# Patient Record
Sex: Female | Born: 1965 | Race: White | Hispanic: No | Marital: Married | Smoking: Never smoker
Health system: Southern US, Community
[De-identification: ages and names within clinical notes are randomized; demographics above are authoritative.]

## PROBLEM LIST (undated history)

## (undated) DIAGNOSIS — K259 Gastric ulcer, unspecified as acute or chronic, without hemorrhage or perforation: Secondary | ICD-10-CM

## (undated) DIAGNOSIS — F329 Major depressive disorder, single episode, unspecified: Secondary | ICD-10-CM

## (undated) DIAGNOSIS — K649 Unspecified hemorrhoids: Secondary | ICD-10-CM

## (undated) DIAGNOSIS — K200 Eosinophilic esophagitis: Secondary | ICD-10-CM

## (undated) DIAGNOSIS — S1121XA Laceration without foreign body of pharynx and cervical esophagus, initial encounter: Secondary | ICD-10-CM

## (undated) DIAGNOSIS — N6091 Unspecified benign mammary dysplasia of right breast: Secondary | ICD-10-CM

## (undated) DIAGNOSIS — D509 Iron deficiency anemia, unspecified: Secondary | ICD-10-CM

## (undated) DIAGNOSIS — E785 Hyperlipidemia, unspecified: Secondary | ICD-10-CM

## (undated) DIAGNOSIS — I10 Essential (primary) hypertension: Secondary | ICD-10-CM

## (undated) DIAGNOSIS — F419 Anxiety disorder, unspecified: Secondary | ICD-10-CM

## (undated) DIAGNOSIS — F32A Depression, unspecified: Secondary | ICD-10-CM

## (undated) DIAGNOSIS — K222 Esophageal obstruction: Secondary | ICD-10-CM

## (undated) DIAGNOSIS — D131 Benign neoplasm of stomach: Secondary | ICD-10-CM

## (undated) DIAGNOSIS — K219 Gastro-esophageal reflux disease without esophagitis: Secondary | ICD-10-CM

## (undated) HISTORY — DX: Benign neoplasm of stomach: D13.1

## (undated) HISTORY — DX: Hyperlipidemia, unspecified: E78.5

## (undated) HISTORY — DX: Major depressive disorder, single episode, unspecified: F32.9

## (undated) HISTORY — DX: Gastric ulcer, unspecified as acute or chronic, without hemorrhage or perforation: K25.9

## (undated) HISTORY — PX: DILATION AND CURETTAGE OF UTERUS: SHX78

## (undated) HISTORY — PX: TUBAL LIGATION: SHX77

## (undated) HISTORY — DX: Esophageal obstruction: K22.2

## (undated) HISTORY — DX: Laceration without foreign body of pharynx and cervical esophagus, initial encounter: S11.21XA

## (undated) HISTORY — PX: UPPER GASTROINTESTINAL ENDOSCOPY: SHX188

## (undated) HISTORY — DX: Gastro-esophageal reflux disease without esophagitis: K21.9

## (undated) HISTORY — DX: Essential (primary) hypertension: I10

## (undated) HISTORY — DX: Unspecified hemorrhoids: K64.9

## (undated) HISTORY — DX: Eosinophilic esophagitis: K20.0

## (undated) HISTORY — PX: COLONOSCOPY: SHX174

## (undated) HISTORY — DX: Depression, unspecified: F32.A

## (undated) HISTORY — DX: Iron deficiency anemia, unspecified: D50.9

## (undated) HISTORY — DX: Anxiety disorder, unspecified: F41.9

---
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
## 1997-09-26 ENCOUNTER — Other Ambulatory Visit: Admission: RE | Admit: 1997-09-26 | Discharge: 1997-09-26 | Payer: Self-pay | Admitting: Obstetrics and Gynecology

## 1998-10-05 ENCOUNTER — Other Ambulatory Visit: Admission: RE | Admit: 1998-10-05 | Discharge: 1998-10-05 | Payer: Self-pay | Admitting: Obstetrics and Gynecology

## 1999-11-04 ENCOUNTER — Other Ambulatory Visit: Admission: RE | Admit: 1999-11-04 | Discharge: 1999-11-04 | Payer: Self-pay | Admitting: Obstetrics and Gynecology

## 2001-01-20 ENCOUNTER — Other Ambulatory Visit: Admission: RE | Admit: 2001-01-20 | Discharge: 2001-01-20 | Payer: Self-pay | Admitting: Obstetrics and Gynecology

## 2002-05-23 ENCOUNTER — Other Ambulatory Visit: Admission: RE | Admit: 2002-05-23 | Discharge: 2002-05-23 | Payer: Self-pay | Admitting: Obstetrics and Gynecology

## 2003-07-12 ENCOUNTER — Other Ambulatory Visit: Admission: RE | Admit: 2003-07-12 | Discharge: 2003-07-12 | Payer: Self-pay | Admitting: Obstetrics and Gynecology

## 2004-02-02 ENCOUNTER — Encounter (INDEPENDENT_AMBULATORY_CARE_PROVIDER_SITE_OTHER): Payer: Self-pay | Admitting: *Deleted

## 2004-02-02 ENCOUNTER — Ambulatory Visit (HOSPITAL_COMMUNITY): Admission: RE | Admit: 2004-02-02 | Discharge: 2004-02-02 | Payer: Self-pay | Admitting: Obstetrics and Gynecology

## 2005-06-24 ENCOUNTER — Other Ambulatory Visit: Admission: RE | Admit: 2005-06-24 | Discharge: 2005-06-24 | Payer: Self-pay | Admitting: Obstetrics and Gynecology

## 2005-08-13 ENCOUNTER — Ambulatory Visit: Payer: Self-pay | Admitting: Cardiology

## 2005-09-30 ENCOUNTER — Ambulatory Visit: Payer: Self-pay | Admitting: Cardiology

## 2005-12-02 ENCOUNTER — Ambulatory Visit: Payer: Self-pay | Admitting: Cardiology

## 2006-05-19 DIAGNOSIS — K259 Gastric ulcer, unspecified as acute or chronic, without hemorrhage or perforation: Secondary | ICD-10-CM

## 2006-05-19 HISTORY — DX: Gastric ulcer, unspecified as acute or chronic, without hemorrhage or perforation: K25.9

## 2006-09-10 ENCOUNTER — Ambulatory Visit: Payer: Self-pay | Admitting: Cardiology

## 2006-10-11 ENCOUNTER — Encounter: Payer: Self-pay | Admitting: Gastroenterology

## 2006-10-12 ENCOUNTER — Encounter: Payer: Self-pay | Admitting: Gastroenterology

## 2006-10-13 ENCOUNTER — Encounter: Payer: Self-pay | Admitting: Gastroenterology

## 2006-10-14 ENCOUNTER — Encounter: Payer: Self-pay | Admitting: Gastroenterology

## 2006-11-03 ENCOUNTER — Encounter: Payer: Self-pay | Admitting: Gastroenterology

## 2007-10-13 ENCOUNTER — Ambulatory Visit: Payer: Self-pay | Admitting: Cardiology

## 2009-05-19 DIAGNOSIS — K200 Eosinophilic esophagitis: Secondary | ICD-10-CM

## 2009-05-19 HISTORY — DX: Eosinophilic esophagitis: K20.0

## 2010-02-04 ENCOUNTER — Encounter (INDEPENDENT_AMBULATORY_CARE_PROVIDER_SITE_OTHER): Payer: Self-pay | Admitting: *Deleted

## 2010-02-11 ENCOUNTER — Ambulatory Visit: Payer: Self-pay | Admitting: Gastroenterology

## 2010-02-11 DIAGNOSIS — R1319 Other dysphagia: Secondary | ICD-10-CM

## 2010-02-12 ENCOUNTER — Ambulatory Visit: Payer: Self-pay | Admitting: Gastroenterology

## 2010-02-13 ENCOUNTER — Ambulatory Visit (HOSPITAL_COMMUNITY): Admission: RE | Admit: 2010-02-13 | Discharge: 2010-02-13 | Payer: Self-pay | Admitting: Gastroenterology

## 2010-02-13 ENCOUNTER — Telehealth (INDEPENDENT_AMBULATORY_CARE_PROVIDER_SITE_OTHER): Payer: Self-pay

## 2010-02-19 ENCOUNTER — Encounter: Payer: Self-pay | Admitting: Gastroenterology

## 2010-03-04 ENCOUNTER — Ambulatory Visit: Payer: Self-pay | Admitting: Gastroenterology

## 2010-03-04 DIAGNOSIS — K200 Eosinophilic esophagitis: Secondary | ICD-10-CM

## 2010-03-04 DIAGNOSIS — K222 Esophageal obstruction: Secondary | ICD-10-CM

## 2010-05-02 ENCOUNTER — Ambulatory Visit: Payer: Self-pay | Admitting: Gastroenterology

## 2010-06-20 NOTE — Letter (Signed)
Summary: EGD Instructions  [facility]  [address] [place], [place] [address]   Phone: [phone]  Fax: [phone]       [name]    02/22/1966    MRN: [id number]       Procedure Day /Date: Tuesday September 27th, 2011     Arrival Time:  12:30pm     Procedure Time: 1:30pm     Location of Procedure:                    _x  _ [facility] (4th Floor)    PREPARATION FOR ENDOSCOPY   On 02/12/10 THE DAY OF THE PROCEDURE:  1.   No solid foods, milk or milk products are allowed after midnight the night before your procedure.  2.   Do not drink anything colored red or purple.  Avoid juices with pulp.  No orange juice.  3.  You may drink clear liquids until11:30am, which is 2 hours before your procedure.                                                                                                CLEAR LIQUIDS INCLUDE: Water Jello Ice Popsicles Tea (sugar ok, no milk/cream) Powdered fruit flavored drinks Coffee (sugar ok, no milk/cream) Gatorade Juice: apple, white grape, white cranberry  Lemonade Clear bullion, consomm, broth Carbonated beverages (any kind) Strained chicken noodle soup Hard Candy   MEDICATION INSTRUCTIONS  Unless otherwise instructed, you should take regular prescription medications with a small sip of water as early as possible the morning of your procedure.        OTHER INSTRUCTIONS  You will need a responsible adult at least 44 years of age to accompany you and drive you home.   This person must remain in the waiting room during your procedure.  Wear loose fitting clothing that is easily removed.  Leave jewelry and other valuables at home.  However, you may wish to bring a book to read or an iPod/MP3 player to listen to music as you wait for your procedure to start.  Remove all body piercing jewelry and leave at home.  Total time from sign-in until discharge is approximately 2-3 hours.  You should go home  directly after your procedure and rest.  You can resume normal activities the day after your procedure.  The day of your procedure you should not:   Drive   Make legal decisions   Operate machinery   Drink alcohol   Return to work  You will receive specific instructions about eating, activities and medications before you leave.    The above instructions have been reviewed and explained to me by   Marchelle Folks.     I fully understand and can verbalize these instructions _____________________________ Date _________

## 2010-06-20 NOTE — Assessment & Plan Note (Signed)
Summary: F/U APPT...LSW.   History of Present Illness Visit Type: Follow-up Visit Primary GI MD: Elie Goody MD [facility] Primary Provider: Verlee Rossetti, MD  Requesting Provider: na Chief Complaint: F/u for dysphagia. Pt states that she is fine and denies any GI complaints  History of Present Illness:   [name] has had complete resolution of her dysphagia on Flovent. She discontinued omeprazole. She has no gastrointestinal complaints.   GI Review of Systems      Denies abdominal pain, acid reflux, belching, bloating, chest pain, dysphagia with liquids, dysphagia with solids, heartburn, loss of appetite, nausea, vomiting, vomiting blood, weight loss, and  weight gain.        Denies anal fissure, black tarry stools, change in bowel habit, constipation, diarrhea, diverticulosis, fecal incontinence, heme positive stool, hemorrhoids, irritable bowel syndrome, jaundice, light color stool, liver problems, rectal bleeding, and  rectal pain.   Current Medications (verified): 1)  Zoloft 100 Mg Tabs (Sertraline Hcl) .... Take 2 Tabs Every Morning 2)  Bupropion Hcl 150 Mg Xr24h-Tab (Bupropion Hcl) .... Take Three Tabs By Mouth Every Morning 3)  Ambien 10 Mg Tabs (Zolpidem Tartrate) .... Take 1/2 To 1 Tablet By Mouth At Bedtime 4)  One-A-Day Extras Antioxidant  Caps (Multiple Vitamins-Minerals) .... Take One By Mouth Once Daily 5)  Vitamin D 1000 Unit Tabs (Cholecalciferol) .... Take One By Mouth Once Daily 6)  Flovent Hfa 110 Mcg/act Aero (Fluticasone Propionate  Hfa) .... As Directed  Allergies (verified): No Known Drug Allergies  Past History:  Past Medical History: Hyperlipidemia Anxiety Disorder Depression Esophageal Stricture 2008 Eosinophilic esophagitis 2011 Fundic gland polyps Antral Ulcer Microcytic Hyperchromic anemia Partial esophageal tear Iron-deficiency anemia GERD   Past Surgical History: Reviewed history from 02/11/2010 and no changes required. Knee surgery  1986 C-section x 2 6045,4098 D & C Tubal Ligation  Family History: Reviewed history from 02/11/2010 and no changes required. Family History of Heart Disease: Mother Family History of Diabetes: mother No FH of Colon Cancer:  Social History: Reviewed history from 02/11/2010 and no changes required. 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  Alcohol Use - no Daily Caffeine Use 16 ounces a day Illicit Drug Use - no  Review of Systems       The pertinent positives and negatives are noted as above and in the HPI. All other ROS were reviewed and were negative.   Vital Signs:  Patient profile:   45 year old female Height:      64.5 inches Weight:      153 pounds BMI:     25.95 BSA:     1.76 Pulse rate:   76 / minute Pulse rhythm:   regular BP sitting:   120 / 64  (left arm) Cuff size:   regular  Vitals Entered By: Ok Anis CMA (May 02, 2010 10:25 AM)  Physical Exam  General:  Well developed, well nourished, no acute distress. Head:  Normocephalic and atraumatic. Eyes:  PERRLA, no icterus. Mouth:  No deformity or lesions, dentition normal. Lungs:  Clear throughout to auscultation. Heart:  Regular rate and rhythm; no murmurs, rubs,  or bruits. Abdomen:  Soft, nontender and nondistended. No masses, hepatosplenomegaly or hernias noted. Normal bowel sounds. Psych:  Alert and cooperative. Normal mood and affect.  Impression & Recommendations:  Problem # 1:  EOSINOPHILIC ESOPHAGITIS (ICD-530.13) Excellent symptomatic response to Flovent. Continue Flovent. Referral to an allergist to evaluate for food allergies. She states her primary physician is planning to start  the evaluation with blood testing. She can obtain a referral via her primary care's office or our office per her preference.  Patient Instructions: 1)  Refill of Flovent has been sent to your pharmacy, swallow 4 puffs three times a day x 6 weeks- take out spacer. 2)  Please schedule a follow-up  appointment in 3 months. 3)  Copy sent to : Verlee Rossetti, MD 4)  The medication list was reviewed and reconciled.  All changed / newly prescribed medications were explained.  A complete medication list was provided to the patient / caregiver.  Prescriptions: FLOVENT HFA 110 MCG/ACT AERO (FLUTICASONE PROPIONATE  HFA) swallow 4 puffs three times a day x 6 weeks- take out spacer  #1 x 1   Entered by:   Christie Nottingham CMA (AAMA)   Authorized by:   Meryl Dare MD [facility]   Signed by:   Christie Nottingham CMA (AAMA) on 05/02/2010   Method used:   Electronically to        [facility]* (retail)       [address]       [place], [place]  [address]       Ph: [phone]       Fax: [phone]   RxID:   [id number]

## 2010-06-20 NOTE — Procedures (Signed)
Summary: EGD/Timothy Misenheimer MD  EGD/Timothy Misenheimer MD   Imported By: Lester Orono 02/13/2010 08:01:35  _____________________________________________________________________  External Attachment:    Type:   Image     Comment:   External Document

## 2010-06-20 NOTE — Assessment & Plan Note (Signed)
Summary: f/u EGD, discuss results/all   History of Present Illness Visit Type: Follow-up Visit Primary GI MD: Elie Goody MD [facility] Primary Provider: Raenette Rover, MD Chief Complaint: follow-up EGD  History of Present Illness:   [name] returns today with improved symptoms of dysphasia. She still has occasional dysphagia when eating solids rapidly or drinking cold or carbonated beverages. Esophageal biopsies revealed findings consistent with eosinophilic esophagitis.   GI Review of Systems    Reports dysphagia with liquids and  dysphagia with solids.      Denies abdominal pain, acid reflux, belching, bloating, chest pain, heartburn, loss of appetite, nausea, vomiting, vomiting blood, weight loss, and  weight gain.        Denies anal fissure, black tarry stools, change in bowel habit, constipation, diarrhea, diverticulosis, fecal incontinence, heme positive stool, hemorrhoids, irritable bowel syndrome, jaundice, light color stool, liver problems, rectal bleeding, and  rectal pain.   Current Medications (verified): 1)  Zoloft 100 Mg Tabs (Sertraline Hcl) .... Take 2 Tabs Every Morning 2)  Bupropion Hcl 150 Mg Xr24h-Tab (Bupropion Hcl) .... Take Three Tabs By Mouth Every Morning 3)  Ambien 10 Mg Tabs (Zolpidem Tartrate) .... Take 1/2 To 1 Tablet By Mouth At Bedtime 4)  One-A-Day Extras Antioxidant  Caps (Multiple Vitamins-Minerals) .... Take One By Mouth Once Daily 5)  Vitamin D 1000 Unit Tabs (Cholecalciferol) .... Take One By Mouth Once Daily 6)  Omeprazole 20 Mg Cpdr (Omeprazole) .... One Tablet By Mouth Every Morning  Allergies (verified): No Known Drug Allergies  Past History:  Past Medical History: Hyperlipidemia Anxiety Disorder Depression Esophageal Stricture 2008 Eosinophilic esophagitis 2011 Fundic gland polyps Antral Ulcer Microcytic Hyperchromic anemia Partial esophageal tear Iron-deficiency anemia  Past Surgical History: Reviewed history from 02/11/2010  and no changes required. Knee surgery 1986 C-section x 2 2956,2130 D & C Tubal Ligation  Family History: Reviewed history from 02/11/2010 and no changes required. Family History of Heart Disease: Mother Family History of Diabetes: mother No FH of Colon Cancer:  Social History: Reviewed history from 02/11/2010 and no changes required. 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  Alcohol Use - no Daily Caffeine Use 16 ounces a day Illicit Drug Use - no  Review of Systems       The pertinent positives and negatives are noted as above and in the HPI. All other ROS were reviewed and were negative.   Vital Signs:  Patient profile:   45 year old female Height:      64.5 inches Weight:      155.25 pounds BMI:     26.33 Pulse rate:   72 / minute Pulse rhythm:   regular BP sitting:   118 / 70  (left arm)  Vitals Entered By: Milford Cage NCMA (March 04, 2010 9:40 AM)  Physical Exam  General:  Well developed, well nourished, no acute distress. Head:  Normocephalic and atraumatic. Eyes:  PERRLA, no icterus. Mouth:  No deformity or lesions, dentition normal. Lungs:  Clear throughout to auscultation. Heart:  Regular rate and rhythm; no murmurs, rubs,  or bruits. Abdomen:  Soft, nontender and nondistended. No masses, hepatosplenomegaly or hernias noted. Normal bowel sounds. Psych:  Alert and cooperative. Normal mood and affect.  Impression & Recommendations:  Problem # 1:  EOSINOPHILIC ESOPHAGITIS (ICD-530.13) Begin treatment with Flovent, as outlined. The disease and management were discussed with the patient in detail and answered her questions.  Problem # 2:  ESOPHAGEAL STRICTURE (ICD-530.3) Esophageal stricture was noted at  the EG junction and was dilated. Her dysphagia symptoms have improved. She may have GERD with a peptic stricture. Continue antireflux measures and omeprazole 20 mg q.a.m.  Patient Instructions: 1)  Your prescription for Flovent swallow 4 puffs  three times a day x 6 weeks has been sent to your pharmacy. Please take out spacer on inhaler. Please do not eat or drink 30 minutes after you swallow the medication. 2)  Eosinophilic esophagitis brochure given.  3)  Please continue current medications.  4)  Please schedule a follow-up appointment in  8 weeks.  5)  Copy sent to : Raenette Rover, MD 6)  The medication list was reviewed and reconciled.  All changed / newly prescribed medications were explained.  A complete medication list was provided to the patient / caregiver.  Prescriptions: FLOVENT HFA 110 MCG/ACT AERO (FLUTICASONE PROPIONATE  HFA) swallow 4 puffs three times a day x 6 weeks- take out spacer  #1 x 2   Entered by:   Christie Nottingham CMA (AAMA)   Authorized by:   Meryl Dare MD [facility]   Signed by:   Christie Nottingham CMA (AAMA) on 03/04/2010   Method used:   Electronically to        [facility] (retail)       [address]       [place], [place]  [address]       Ph: [phone]       Fax: [phone]   RxID:   [id number]

## 2010-06-20 NOTE — Progress Notes (Signed)
Summary: Esophageal pain following EGD/dil  ---- Converted from flag ---- ---- 02/13/2010 9:35 AM, Meryl Dare MD [facility] wrote: NPO and schedule a 2 view chest Xray and Gastrograffin swallow STAT. Convert this to a phone note.  ---- 02/13/2010 8:09 AM, Greer Ee RN wrote: f/u call placed to pt. this a.m. she stated that when she swallow she has a pain level of 7, but the pain does not start until it gets to her chest and it is a burning feeling. please advise. ------------------------------  Phone Note Outgoing Call Call back at [facility] Phone [phone]   Call placed by: Darcey Nora RN, CGRN,  February 13, 2010 9:57 AM Call placed to: Patient Summary of Call: Discussed with the patient.  She will come to pick up rx for [facility] chest x-ray and gastrograffin swallow.  Phones at [facility] are not working.  We will have her handcarry an order.  Patient cell number [phone].  She is advised she should wait at the hospital until we call her after the x-rays Initial call taken by: Darcey Nora RN, CGRN,  February 13, 2010 9:58 AM

## 2010-06-20 NOTE — Procedures (Signed)
Summary: EGD/[facility]  EGD/[place]   Imported By: Lester Calion 02/13/2010 12:24:44  _____________________________________________________________________  External Attachment:    Type:   Image     Comment:   External Document

## 2010-06-20 NOTE — Letter (Signed)
Summary: [facility]  [facility]   Imported By: Lennie Odor 02/12/2010 16:03:34  _____________________________________________________________________  External Attachment:    Type:   Image     Comment:   External Document

## 2010-06-20 NOTE — Letter (Signed)
Summary: [facility]  [facility]   Imported By: Lester Champaign 02/13/2010 12:14:07  _____________________________________________________________________  External Attachment:    Type:   Image     Comment:   External Document

## 2010-06-20 NOTE — Letter (Signed)
Summary: Patient Notice-Endo Biopsy Results  [facility]  [address] [place], [place] [address]   Phone: [phone]  Fax: [phone]        February 19, 2010 MRN: [id number]    [name] [address] [place], [place]  [address]    Dear [name],  I am pleased to inform you that the biopsies taken during your recent endoscopic examination did not show any evidence of cancer upon pathologic examination. The biopsies showed findings suggestion eosinophilic esophagitis.  Please call [phone] to schedule a return visit to review      your condition.  Continue with the treatment plan as outlined on the day of your      exam.  Please call us if you are having persistent problems or have questions about your condition that have not been fully answered at this time.  Sincerely,  Meryl Dare MD [facility]  This letter has been electronically signed by your physician.  Appended Document: Patient Notice-Endo Biopsy Results letter mailed

## 2010-06-20 NOTE — Assessment & Plan Note (Signed)
Summary: food getting stuck in throat ...em   History of Present Illness Visit Type: Initial Visit Primary GI MD: Elie Goody MD [facility] Primary Provider: Raenette Rover, MD Chief Complaint: Patient had a EGD at [facility] and at [facility] in 2008 for a esophgeal stricture and she is now having the same issues as she did then. She complains of solid foor dysphagia and epigastric pain.  History of Present Illness:   This is a 45 year old female complaining of dysphagia. She had a food impaction while at the beach in [place] in 2008 which was removed endoscopically. I have reviewed records from Dr. Jennye Boroughs and her barium esophagram from [facility]. Apparently, there was a mucosal tear noted at EGD with no evidence of deep injury or perforation. She was hospitalized in [facility] for about 3 days and then returned for follow up care with Dr. Laurell Roof. She underwent upper endoscopy in June 2008 which showed a 13 mm esophagogastric junction stricture, which was dilated to 71 [place]. Small benign gastric fundic polyps were also noted. She states she stayed on Nexium for several months and discontinued the medication.  Over the past 2 months, se has had worsening problems with dysphagia, mainly to rice and beef. She develops chest and epigastic pain and burning when food is stuck.    GI Review of Systems    Reports dysphagia with solids.      Denies abdominal pain, acid reflux, belching, bloating, chest pain, dysphagia with liquids, heartburn, loss of appetite, nausea, vomiting, vomiting blood, weight loss, and  weight gain.        Denies anal fissure, black tarry stools, change in bowel habit, constipation, diarrhea, diverticulosis, fecal incontinence, heme positive stool, hemorrhoids, irritable bowel syndrome, jaundice, light color stool, liver problems, rectal bleeding, and  rectal pain. Preventive Screening-Counseling & Management  Alcohol-Tobacco     Smoking Status:  never      Drug Use:  no.     Current Medications (verified): 1)  Zoloft 100 Mg Tabs (Sertraline Hcl) .... Take 2 Tabs Every Morning 2)  Bupropion Hcl 150 Mg Xr24h-Tab (Bupropion Hcl) .... Take Three Tabs By Mouth Every Morning 3)  Ambien 10 Mg Tabs (Zolpidem Tartrate) .... Take 1/2 To 1 Tablet By Mouth At Bedtime 4)  One-A-Day Extras Antioxidant  Caps (Multiple Vitamins-Minerals) .... Take One By Mouth Once Daily 5)  Vitamin D 1000 Unit Tabs (Cholecalciferol) .... 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  Alcohol Use - no Daily Caffeine Use 16 ounces a day Illicit Drug Use - no Smoking Status:  never Drug Use:  no  Review of Systems       The patient complains of anxiety-new and sleeping problems.         The pertinent positives and negatives are noted as above and in the HPI. All other ROS were reviewed and were negative.   Vital Signs:  Patient profile:   45 year old female Height:      64.25 inches Weight:      154.6 pounds BMI:     26.43 Pulse rate:  88 / minute Pulse rhythm:   regular BP sitting:   120 / 78  (left arm) Cuff size:   regular  Vitals Entered By: Harlow Mares CMA Duncan Dull) (February 11, 2010 9:21 AM)  Physical Exam  General:  Well developed, well nourished, no acute distress. Head:  Normocephalic and atraumatic. Eyes:  PERRLA, no icterus. Ears:  Normal auditory acuity. Mouth:  No deformity or lesions, dentition normal. Neck:  Supple; no masses or thyromegaly. Lungs:  Clear throughout to auscultation. Heart:   Regular rate and rhythm; no murmurs, rubs,  or bruits. Abdomen:  Soft, nontender and nondistended. No masses, hepatosplenomegaly or hernias noted. Normal bowel sounds. Msk:  Symmetrical with no gross deformities. Normal posture. Pulses:  Normal pulses noted. Extremities:  No clubbing, cyanosis, edema or deformities noted. Neurologic:  Alert and  oriented x4;  grossly normal neurologically. Cervical Nodes:  No significant cervical adenopathy. Inguinal Nodes:  No significant inguinal adenopathy. Psych:  Alert and cooperative. Normal mood and affect.  Impression & Recommendations:  Problem # 1:  OTHER DYSPHAGIA (ICD-787.29)  Progressively worsening solid food dysphagia with a prior history of an esophageal stricture dilated in 2008. I suspect she has a recurrent stricture. I have encouraged her to remain on a daily proton pump inhibitor with antireflux measures long-term to help prevent recurrent strictures. The risks, benefits and alternatives to endoscopy with possible biopsy and possible dilation were discussed with the patient and they consent to proceed. The procedure will be scheduled electively.  Orders: EGD SAV (EGD SAV)  Problem # 2:  SCREENING COLORECTAL-CANCER (ICD-V76.51) Average risk for colorectal cancer. Begin screening colonoscopy at age 45.  Patient Instructions: 1)  Pick up your prescription at your pharmacy.  2)  Upper Endoscopy with Dilatation brochure given.  3)  Copy sent to : Raenette Rover, MD 4)  The medication list was reviewed and reconciled.  All changed / newly prescribed medications were explained.  A complete medication list was provided to the patient / caregiver.  Prescriptions: OMEPRAZOLE 20 MG CPDR (OMEPRAZOLE) one tablet by mouth every morning  #30 x 11   Entered by:   Christie Nottingham CMA (AAMA)   Authorized by:   Meryl Dare MD [facility]   Signed by:   Meryl Dare MD [facility] on 02/11/2010   Method used:   Electronically to        [facility]  (retail)       [address]       [place], [place]  [address]       Ph: [phone]       Fax: [phone]   RxID:   [id number]   Appended Document: food getting stuck in throat ...em    Clinical Lists Changes  Observations: Added new observation of COLONNXTDUE: 11/2016 (02/11/2010 10:47)

## 2010-06-20 NOTE — Procedures (Signed)
Summary: Upper Endoscopy w/DIL  Patient: [name] Note: All result statuses are Final unless otherwise noted.  Tests: (1) Upper Endoscopy w/DIL (UED)  UED Upper Endoscopy w/DIL                             DONE (C)      [facility]     [address]     [place], [place]  [address]           ENDOSCOPY PROCEDURE REPORT     PATIENT:  [name], [name]  MR#:  [id number]     BIRTHDATE:  30-Dec-1965, 44 yrs. old  GENDER:  female     ENDOSCOPIST:  Judie Petit T. Russella Dar, MD, [facility]           PROCEDURE DATE:  02/12/2010     PROCEDURE:  EGD with dilatation over guidewire,  with biopsy     ASA CLASS:  Class II     INDICATIONS:  dysphagia     MEDICATIONS:  Fentanyl 75 mcg IV, Versed 7 mg IV, Benadryl 25 mg     IV     TOPICAL ANESTHETIC:  Exactacain Spray     DESCRIPTION OF PROCEDURE:   After the risks benefits and     alternatives of the procedure were thoroughly explained, informed     consent was obtained.  The LB GIF-H180 [id number] endoscope was     introduced through the mouth and advanced to the second portion of     the duodenum, without limitations.  The instrument was slowly     withdrawn as the mucosa was fully examined.     <<PROCEDUREIMAGES>>     A stricture was found at the gastroesophageal junction. It was     circumferential and benign appearing measuring 15mm in diameter.     Savary / guidewire with 16 and 17 mm dilations performed with     minimal resistance and minimal heme on dilators. The esophagus was     normal in appearance except for subtle linear furrow. Random     biopsies were obtained and sent to pathology.  R/O eosinophilic     esophagitis. The stomach was entered and closely examined. The     pylorus, antrum, angularis, and lesser curvature were well     visualized, including a retroflexed view of the cardia and fundus.     The stomach wall was normally distensable. The scope passed easily     through the pylorus into the duodenum. The duodenal bulb was  normal in appearance, as was the postbulbar duodenum. Retroflexed     views revealed no abnormalities.  The scope was then withdrawn     from the patient and the procedure completed.     COMPLICATIONS:  None           ENDOSCOPIC IMPRESSION:     1) Stricture at the gastroesophageal junction           RECOMMENDATIONS:     1) Anti-reflux regimen     2) Await pathology results     3) continue PPI     4) post dilation instructions     5) office visit in one year and as needed           Malcolm T. Russella Dar, MD, [facility]           CC:  Raenette Rover, MD           n.  REVISED:  02/12/2010 03:38 PM     eSIGNED:   Judie Petit T. Stark at 02/12/2010 03:38 PM           Megan Salon, [id number]  Note: An exclamation mark (!) indicates a result that was not dispersed into the flowsheet. Document Creation Date: 02/12/2010 3:40 PM _______________________________________________________________________  (1) Order result status: Final Collection or observation date-time: 02/12/2010 13:47 Requested date-time:  Receipt date-time:  Reported date-time:  Referring Physician:   Ordering Physician: Claudette Head [phone]) Specimen Source:  Source: Launa Grill Order Number: [id number] Lab site:

## 2010-06-20 NOTE — Letter (Signed)
Summary: New Patient letter  [facility]  [address] [place], [place] [address]   Phone: [phone]  Fax: [phone]       02/04/2010 MRN: [id number]  [facility] [place] [address] [place], [place]  [address]  Dear Ms. [name],  Welcome to the Gastroenterology Division at [facility].    You are scheduled to see Dr.  Claudette Head on September 26, 2011at 9:30am on the 3rd floor at [facility], [address]  We ask that you try to arrive at our office 15 minutes prior to your appointment time to allow for check-in.  We would like you to complete the enclosed self-administered evaluation form prior to your visit and bring it with you on the day of your appointment.  We will review it with you.  Also, please bring a complete list of all your medications or, if you prefer, bring the medication bottles and we will list them.  Please bring your insurance card so that we may make a copy of it.  If your insurance requires a referral to see a specialist, please bring your referral form from your primary care physician.  Co-payments are due at the time of your visit and may be paid by cash, check or credit card.     Your office visit will consist of a consult with your physician (includes a physical exam), any laboratory testing he/she may order, scheduling of any necessary diagnostic testing (e.g. x-ray, ultrasound, CT-scan), and scheduling of a procedure (e.g. Endoscopy, Colonoscopy) if required.  Please allow enough time on your schedule to allow for any/all of these possibilities.    If you cannot keep your appointment, please call [phone] to cancel or reschedule prior to your appointment date.  This allows [place] the opportunity to schedule an appointment for another patient in need of care.  If you do not cancel or reschedule by 5 p.m. the business day prior to your appointment date, you will be charged a $50.00 late cancellation/no-show fee.    Thank  you for choosing [facility] for your medical needs.  We appreciate the opportunity to care for you.  Please visit [place] at our website  to learn more about our practice.                     Sincerely,                                                             The Gastroenterology Division

## 2010-10-01 NOTE — Assessment & Plan Note (Signed)
[facility]                            CARDIOLOGY OFFICE NOTE   [name], [name]                        MRN:          [id number]  DATE:10/13/2007                            DOB:          13-Sep-1965    [name] returns today for further management of her mixed  hyperlipidemia and positive family history of coronary disease.   She is now established with a primary care physician in [place] and is  on 80 mg of simvastatin.  She had had a brilliant response to Crestor  10, but her insurance would not cover it.   She is anxious to get into a boot camp to really get in good shape.  She has been exercising on a regular basis recently at a lower  intensity.  She is having no symptoms of angina or ischemia.  She is  having no dyspnea on exertion.   She is currently on:  1. Simvastatin 80 mg q.h.s.  2. Wellbutrin XL 3 mg a day.  3. Lunesta 3 mg nightly.  4. Zoloft 200 mg a day.   The rest of her review of systems are negative.   Her blood pressure is 110/80, her pulse 84 and regular.  Weight is 153.  HEENT:  Normocephalic, atraumatic.  PERRLA.  Extraocular movements  intact.  Sclerae clear.  Facial symmetry is normal.  Carotid upstrokes are equal bilaterally without bruits, no JVD.  Thyroid  is not enlarged.  Trachea is midline.  LUNGS:  Clear.  HEART:  Reveals a regular rate and rhythm.  No murmur, rub, or gallop.  ABDOMEN:  Soft, good bowel sounds.  No midline bruit.  No hepatomegaly.  No hepatic tenderness.  EXTREMITIES:  No cyanosis, clubbing or edema.  Pulses are intact.  NEURO:  Exam is intact.   EKG shows sinus rhythm with poor  R wave progression across the anterior  precordium which is stable.   [name] is doing well.  I think it is fine for her to go ahead and  participate in a higher intensity exercise program.  She is having no  symptoms at present.   Her lipids and blood work are being followed by Dr. Charlesetta Garibaldi in [place].  I  will plan on seeing her back in 2 years.     Thomas C. Daleen Squibb, MD, [facility]  Electronically Signed    TCW/MedQ  DD: 10/13/2007  DT: 10/13/2007  Job #: [id number]   cc:   Duke Salvia. Marcelle Overlie, M.D.

## 2010-10-04 NOTE — Op Note (Signed)
NAMESHARA, HARTIS                           ACCOUNT NO.:  [id number]   MEDICAL RECORD NO.:  [id number]                   PATIENT TYPE:  AMB   LOCATION:  [facility]                                  [facility]ACILITY:  [facility]   PHYSICIAN:  Duke Salvia. Marcelle Overlie, M.D.            DATE OF BIRTH:  1966/02/15   DATE OF PROCEDURE:  02/02/2004  DATE OF DISCHARGE:                                 OPERATIVE REPORT   PREOPERATIVE DIAGNOSES:  Abnormal uterine bleeding, endometrial polyps.   POSTOPERATIVE DIAGNOSES:  Abnormal uterine bleeding, endometrial polyps.   PROCEDURE:  D&C hysteroscopy.   SURGEON:  Duke Salvia. Marcelle Overlie, M.D.   ANESTHESIA:  General.   COMPLICATIONS:  None.   DRAINS:  In and out Foley catheter.   ESTIMATED BLOOD LOSS:  5-10 mL.   DESCRIPTION OF PROCEDURE:  The patient was taken to the operating room and  after an adequate level of general anesthesia was obtained with the  patient's legs in stirrups, the vaginal area was prepped and draped and the  bladder was drained. EUA revealed the uterus to be anterior, upper limit of  normal size, adnexa negative.  A speculum was positioned. The cervix grasped  with a tenaculum was sounded to 10 cm progressively dilated to a 29 Pratt  dilator. The 7 mm continuous flow hysteroscope was inserted. An abundant  amount of anterior and posterior along with fundal tissue buildup was noted.  Some of this appeared to be polypoid. A D&C was carried out and exploration  with the polyp forceps revealing a moderate to large amount of tissue some  of it appearing polypoid. The scope was then reinserted and the cavity  irrigated. There was still some tissue buildup in the left fundal area. A  repeat D&C was carried out and then the resinspection with the hysteroscopy  revealed all walls to be clean. Tissue was submitted to pathology. She  tolerated this well and went to the recovery room in good condition.      RMH/MEDQ  D:  02/02/2004  T:  02/03/2004  Job:   [id number]

## 2010-10-04 NOTE — Assessment & Plan Note (Signed)
[facility]                            CARDIOLOGY OFFICE NOTE   Sherry, Guerra                        MRN:          [id number]  DATE:09/10/2006                            DOB:          [birth year]    [name] returns today for further management of her mixed  hyperlipidemia and positive family history of coronary disease.  She is  off Crestor, said she could not afford it; she had an excellent response  to 10 mg, as documented in the chart.   She is having no symptoms of ischemic heart disease.  She is interested  in going back on a statin and would like to have a generic.   Her blood work was checked recently by Dr. Marcelle Overlie and showed a total  cholesterol of 254, triglycerides of 160, HDL 47, LDL of 175.   PHYSICAL EXAMINATION:  Her blood pressure today is 121/77.  Her pulse is  83 and regular.  Her weight is up 17 pounds from March 2007 to 159.  HEENT:  Normocephalic, atraumatic.  PERRLA.  Extraocular movements  intact.  Sclerae are clear.  Facial symmetry is normal.  Carotid  upstrokes are equal bilaterally without bruits.  No JVD.  Thyroid is not  enlarged.  Trachea is midline.  LUNGS:  Clear.  HEART:  Reveals a nondisplaced PMI, normal S1 and S2.  ABDOMEN:  Soft with good bowel sounds.  EXTREMITIES:  No edema.  Pulses are brisk.  NEUROLOGIC:  Exam is intact.   ELECTROCARDIOGRAM:  Essentially normal.   I have had a nice chat with [name] today.  She agrees to go back on  Simvastatin at 20 mg a day.  We will check lipids and LFTs in 6 weeks.  Otherwise, I will see her back in 6 months.  Of course, I have asked her  to walk 3 hours as week and to try to get her weight down.     Thomas C. Daleen Squibb, MD, [facility]  Electronically Signed    TCW/MedQ  DD: 09/10/2006  DT: 09/10/2006  Job #: [id number]   cc:   Duke Salvia. Marcelle Overlie, M.D.

## 2010-10-04 NOTE — H&P (Signed)
[name], [name]                           ACCOUNT NO.:  [id number]   MEDICAL RECORD NO.:  [id number]                   PATIENT TYPE:  AMB   LOCATION:  [facility]                                  [facility]ACILITY:  [facility]   PHYSICIAN:  Duke Salvia. Marcelle Overlie, M.D.            DATE OF BIRTH:  09-01-1965   DATE OF ADMISSION:  DATE OF DISCHARGE:                                HISTORY & PHYSICAL   DATE OF SCHEDULED SURGERY:  February 02, 2004   CHIEF COMPLAINT:  Abnormal uterine bleeding.   HISTORY OF PRESENT ILLNESS:  A 45 year old G3 P2 who has had a prior tubal.  For the last 3 months she has had some heavy, irregular bleeding  She had an  SHG in our office January 24, 2004 that showed two to three prominent  polyps and presents now for [facility] hysteroscopy.  This procedure including  risks of bleeding, infection, other complications that may require  additional surgery reviewed with her, which she understands and accepts.   PAST MEDICAL HISTORY:  Allergies:  None.  Current medications:  Wellbutrin  and Zoloft.  Past surgical history:  She has had a tubal, two vaginal  deliveries, and prior knee surgery.  Blood type is O negative.   FAMILY HISTORY:  Otherwise negative.   PHYSICAL EXAMINATION:  VITAL SIGNS:  Temperature 98.2, blood pressure  110/78.  HEENT:  Unremarkable.  NECK:  Supple without masses.Marland Kitchen  LUNGS:  Clear.  CARDIOVASCULAR:  Regular rate and rhythm without murmurs, rubs, gallops  noted.  BREASTS:  Without masses.  ABDOMEN:  Soft, flat, nontender.  PELVIC:  Normal external genitalia.  Vagina and cervix clear.  Uterus mid  position, mobile, nontender.  Adnexa negative.  EXTREMITIES AND NEUROLOGIC:  Unremarkable.   IMPRESSION:  Abnormal uterine bleeding, endometrial polyps.   PLAN:  D&C hysteroscopy.  Procedure and risks reviewed as above.                                               Richard M. Marcelle Overlie, M.D.    RMH/MEDQ  D:  02/01/2004  T:  02/01/2004  Job:  [id number]

## 2011-01-27 ENCOUNTER — Ambulatory Visit (INDEPENDENT_AMBULATORY_CARE_PROVIDER_SITE_OTHER): Payer: BC Managed Care – PPO | Admitting: Gastroenterology

## 2011-01-27 ENCOUNTER — Encounter: Payer: Self-pay | Admitting: Gastroenterology

## 2011-01-27 VITALS — BP 122/74 | HR 68 | Ht 64.0 in | Wt 159.0 lb

## 2011-01-27 DIAGNOSIS — K200 Eosinophilic esophagitis: Secondary | ICD-10-CM

## 2011-01-27 MED ORDER — FLUTICASONE PROPIONATE HFA 110 MCG/ACT IN AERO: INHALATION_SPRAY | RESPIRATORY_TRACT | Status: DC

## 2011-01-27 NOTE — Patient Instructions (Addendum)
We have sent the following medications to your pharmacy for you to pick up at your convenience: Flovent. 4 puffs three times daily. (Take spacer out and swallow) Please follow up with Dr Russella Dar in 6 months. CC: Dr Marcelle Overlie       D/c'ed rx for flovent with Christiane Ha @ [facility] mail order. Rx was sent to them instead of local pharmacy in error. Ronny Bacon, CMA 01/27/11 @ 9:42 am

## 2011-01-27 NOTE — Progress Notes (Signed)
History of Present Illness: This is a 45 year old female with a history of eosinophilic esophagitis and esophageal strictures. She was placed on Flovent which controlled her symptoms very well. She did not return for recommended followup. She states she ran out of Flovent about 3 months ago. She felt well until approximately 3 weeks ago when she noticed mild difficulties swallowing solid foods. Denies weight loss, abdominal pain, constipation, diarrhea, change in stool caliber, melena, hematochezia, nausea, vomiting, reflux symptoms, chest pain.  Current Medications, Allergies, Past Medical History, Past Surgical History, Family History and Social History were reviewed in [employer] record.  Physical Exam: General: Well developed , well nourished, no acute distress Head: Normocephalic and atraumatic Eyes:  sclerae anicteric, EOMI Ears: Normal auditory acuity Mouth: No deformity or lesions Lungs: Clear throughout to auscultation Heart: Regular rate and rhythm; no murmurs, rubs or bruits Abdomen: Soft, non tender and non distended. No masses, hepatosplenomegaly or hernias noted. Normal Bowel sounds Musculoskeletal: Symmetrical with no gross deformities  Pulses:  Normal pulses noted Extremities: No clubbing, cyanosis, edema or deformities noted Neurological: Alert oriented x 4, grossly nonfocal Psychological:  Alert and cooperative. Normal mood and affect  Assessment and Recommendations:  1. Eosinophilic esophagitis. Mild recurrent dysphagia. Resume Flovent. If her dysphagia symptoms do not improve over the next few weeks consider upper endoscopy with dilation. Recommended followup in 6 months if she is doing well.

## 2012-03-26 ENCOUNTER — Telehealth: Payer: Self-pay | Admitting: Gastroenterology

## 2012-03-26 NOTE — Telephone Encounter (Signed)
Told patient that Dr. Russella Dar wanted her to follow up in 6 months after her appt last September for her Eosinophilic Esophagitis. Patient scheduled for appt on Monday to discuss refills for this disease.

## 2012-03-29 ENCOUNTER — Encounter: Payer: Self-pay | Admitting: Gastroenterology

## 2012-03-29 ENCOUNTER — Ambulatory Visit (INDEPENDENT_AMBULATORY_CARE_PROVIDER_SITE_OTHER): Payer: BC Managed Care – PPO | Admitting: Gastroenterology

## 2012-03-29 VITALS — BP 126/82 | HR 72 | Ht 64.0 in | Wt 176.4 lb

## 2012-03-29 DIAGNOSIS — K200 Eosinophilic esophagitis: Secondary | ICD-10-CM

## 2012-03-29 DIAGNOSIS — R1319 Other dysphagia: Secondary | ICD-10-CM

## 2012-03-29 MED ORDER — FLUTICASONE PROPIONATE HFA 110 MCG/ACT IN AERO: INHALATION_SPRAY | RESPIRATORY_TRACT | Status: DC

## 2012-03-29 NOTE — Progress Notes (Signed)
History of Present Illness: This is a 46 year old female with a history of eosinophilic esophagitis and esophageal strictures. She was placed on Flovent which controlled her symptoms very well. She did not return for recommended followup at 6 months. She states she ran out of Flovent about 2 months ago. She felt well until approximately 3 weeks ago when she noticed mild difficulties swallowing solid foods. Denies weight loss, abdominal pain, constipation, diarrhea, change in stool caliber, melena, hematochezia, nausea, vomiting, reflux symptoms, chest pain.  Current Medications, Allergies, Past Medical History, Past Surgical History, Family History and Social History were reviewed in [employer] record.  Physical Exam: General: Well developed , well nourished, no acute distress Head: Normocephalic and atraumatic Eyes:  sclerae anicteric, EOMI Ears: Normal auditory acuity Mouth: No deformity or lesions Lungs: Clear throughout to auscultation Heart: Regular rate and rhythm; no murmurs, rubs or bruits Abdomen: Soft, non tender and non distended. No masses, hepatosplenomegaly or hernias noted. Normal Bowel sounds Musculoskeletal: Symmetrical with no gross deformities  Pulses:  Normal pulses noted Extremities: No clubbing, cyanosis, edema or deformities noted Neurological: Alert oriented x 4, grossly nonfocal Psychological:  Alert and cooperative. Normal mood and affect  Assessment and Recommendations:  1. Eosinophilic esophagitis. Mild recurrent dysphagia. Resume Flovent as previously recommended for long-term use. Recommended that she contact my office if her dysphasia is not well controlled within 2 to 4 weeks. Recommended followup in 12 months if she is doing well.

## 2012-03-29 NOTE — Patient Instructions (Signed)
We have sent the following medications to your pharmacy for you to pick up at your convenience: Flovent.

## 2013-06-29 ENCOUNTER — Ambulatory Visit: Payer: BC Managed Care – PPO | Admitting: Gastroenterology

## 2013-07-08 ENCOUNTER — Encounter: Payer: Self-pay | Admitting: Gastroenterology

## 2013-07-08 ENCOUNTER — Ambulatory Visit (INDEPENDENT_AMBULATORY_CARE_PROVIDER_SITE_OTHER): Payer: BC Managed Care – PPO | Admitting: Gastroenterology

## 2013-07-08 VITALS — BP 122/84 | HR 78 | Ht 64.5 in | Wt 172.8 lb

## 2013-07-08 DIAGNOSIS — K645 Perianal venous thrombosis: Secondary | ICD-10-CM

## 2013-07-08 DIAGNOSIS — K5900 Constipation, unspecified: Secondary | ICD-10-CM

## 2013-07-08 DIAGNOSIS — K921 Melena: Secondary | ICD-10-CM

## 2013-07-08 DIAGNOSIS — K200 Eosinophilic esophagitis: Secondary | ICD-10-CM

## 2013-07-08 MED ORDER — SOD PICOSULFATE-MAG OX-CIT ACD 10-3.5-12 MG-GM-GM PO PACK: 1.0000 | PACK | ORAL | Status: DC

## 2013-07-08 MED ORDER — FLUTICASONE PROPIONATE HFA 110 MCG/ACT IN AERO: INHALATION_SPRAY | RESPIRATORY_TRACT | Status: DC

## 2013-07-08 MED ORDER — HYDROCORTISONE ACETATE 25 MG RE SUPP: 25.0000 mg | Freq: Two times a day (BID) | RECTAL | Status: DC

## 2013-07-08 MED ORDER — HYDROCORTISONE ACE-PRAMOXINE 2.5-1 % RE CREA: 1.0000 "application " | TOPICAL_CREAM | Freq: Two times a day (BID) | RECTAL | Status: DC

## 2013-07-08 NOTE — Progress Notes (Signed)
    History of Present Illness: This is a 48 year old female with chronic constipation. She relates an episode of painless rectal bleeding that occurred spontaneously while shopping at [employer]. Since then she's noted frequent episodes of small amounts of bright red blood when wiping after a bowel movement and intermittent rectal pain. He notes return in swallowing difficulties but has not been taking Flovent as prescribed.   Review of Systems: Pertinent positive and negative review of systems were noted in the above HPI section. All other review of systems were otherwise negative.  Current Medications, Allergies, Past Medical History, Past Surgical History, Family History and Social History were reviewed in [employer] record.  Physical Exam: General: Well developed , well nourished, no acute distress Head: Normocephalic and atraumatic Eyes:  sclerae anicteric, EOMI Ears: Normal auditory acuity Mouth: No deformity or lesions Neck: Supple, no masses or thyromegaly Lungs: Clear throughout to auscultation Heart: Regular rate and rhythm; no murmurs, rubs or bruits Abdomen: Soft, non tender and non distended. No masses, hepatosplenomegaly or hernias noted. Normal Bowel sounds Rectal: Thrombosed external hemorrhoid, no internal lesions, Hemoccult negative brown stool in the vault Musculoskeletal: Symmetrical with no gross deformities  Skin: No lesions on visible extremities Pulses:  Normal pulses noted Extremities: No clubbing, cyanosis, edema or deformities noted Neurological: Alert oriented x 4, grossly nonfocal Cervical Nodes:  No significant cervical adenopathy Inguinal Nodes: No significant inguinal adenopathy Psychological:  Alert and cooperative. Normal mood and affect  Assessment and Recommendations:  1. Eosinophilic esophagitis. She did not return for recommended followup and has been off Flovent for over a year. Her symptoms have returned and she would like  to resume Flovent.  2. Hematochezia, thrombosed external hemorrhoid. Begin standard rectal care instructions and Analpram cream and Anusol-HC suppositories. Schedule colonoscopy to rule out other sources of bleeding. Her thrombosed external hemorrhoid may need surgical attention if it fails to improve. The risks, benefits, and alternatives to colonoscopy with possible biopsy, possible destruction of internal hemorrhoids and possible polypectomy were discussed with the patient and they consent to proceed.

## 2013-07-08 NOTE — Patient Instructions (Signed)
You have been scheduled for a colonoscopy with propofol. Please follow written instructions given to you at your visit today.  Please pick up your prep kit at the pharmacy within the next 1-3 days. If you use inhalers (even only as needed), please bring them with you on the day of your procedure.  We have sent the following medications to your pharmacy for you to pick up at your convenience: Analpram cream, Anusol suppositories and Flovent.  RECTAL CARE INSTRUCTIONS:  1. Sitz Baths twice a day for 10 minutes each. 2. Thoroughly clean and dry the rectum. 3. Put Tucks pad against the rectum at night. 4. Clean the rectum with Balenol lotion after each bowel movement.  Thank you for choosing me and [facility].  Pricilla Riffle. Dagoberto Ligas., MD., Marval Regal  cc: Molli Posey, MD

## 2013-07-11 ENCOUNTER — Encounter: Payer: Self-pay | Admitting: Gastroenterology

## 2013-08-15 ENCOUNTER — Telehealth: Payer: Self-pay | Admitting: Gastroenterology

## 2013-08-15 ENCOUNTER — Emergency Department (HOSPITAL_COMMUNITY)
Admission: EM | Admit: 2013-08-15 | Discharge: 2013-08-15 | Discharge status: Against Medical Advice | Payer: BC Managed Care – PPO

## 2013-08-15 NOTE — Telephone Encounter (Signed)
Patient reports that she was at the ER waiting to be seen and she felt her food impaction release.  She states she has not checked in.  She is encouraged to be evaluated.  She wants to be seen here.  She is given an appt for Thursday at 8:30 with [facility].  She has a history of EE and has not been taking her medication on a consistent basis.  She is advised to remain on a full liquid diet until evaluation on THursday

## 2013-08-16 NOTE — Telephone Encounter (Signed)
Agree with plan. Resume EE for REV with AE. Resume medication as previously prescribed for now.

## 2013-08-18 ENCOUNTER — Encounter: Payer: Self-pay | Admitting: Physician Assistant

## 2013-08-18 ENCOUNTER — Ambulatory Visit (INDEPENDENT_AMBULATORY_CARE_PROVIDER_SITE_OTHER): Payer: BC Managed Care – PPO | Admitting: Physician Assistant

## 2013-08-18 VITALS — BP 104/64 | HR 80 | Ht 63.75 in | Wt 173.2 lb

## 2013-08-18 DIAGNOSIS — K200 Eosinophilic esophagitis: Secondary | ICD-10-CM

## 2013-08-18 DIAGNOSIS — R1314 Dysphagia, pharyngoesophageal phase: Secondary | ICD-10-CM

## 2013-08-18 MED ORDER — SOD PICOSULFATE-MAG OX-CIT ACD 10-3.5-12 MG-GM-GM PO PACK: PACK | ORAL | Status: DC

## 2013-08-18 NOTE — Patient Instructions (Addendum)
We added an upper endoscopy to your procedure scheduled for 09-08-2013 at 130 pm.  We have sent the following medications to your pharmacy for you to pick up at your convenience: [place]

## 2013-08-18 NOTE — Progress Notes (Signed)
Subjective:    Patient ID: [name], female    DOB: [birth year], 48 y.o.   MRN: [id number]  HPI  [name] is a pleasant 48 year old white female known to Dr. Fuller Plan who has a diagnosis of eosinophilic esophagitis. Is She also had recently been seen for a thrombosed external hemorrhoid . She has been treated with swallowed fluticasone via a Flovent inhaler 3 times daily . Patient had been seen in February of 2015 that time had been off of medication for while and was having mild symptoms and was restarted. He says she's been using a medication very regularly but has been having more difficulty swallowing now over the past month. She says she had an episode earlier this week at which time she had a piece of food lodge for about an hour and half. She calls here and was told to go to the emergency room but while she was on her way there she felt it finally passed. She said she had eaten chicken pot which had become lodged. As he says she's a has been having more frequent episodes that was the longest one that she had had in some time. She typically gets some burning in her esophagus when the eosinophilic esophagitis as active as well. She has not been having that recently. Patient did undergo upper endoscopy in September 2011 at that time she was found to have a stricture at the GE junction which was every dilated to 17 mm. At that time she had also been noted to have the throat appearing esophagus with concerns for possible underlying eosinophilic changes.  Review of Systems  Constitutional: Negative.   HENT: Positive for trouble swallowing.   Eyes: Negative.   Cardiovascular: Negative.   Gastrointestinal: Negative.   Endocrine: Negative.   Genitourinary: Negative.   Musculoskeletal: Negative.   Allergic/Immunologic: Negative.   Neurological: Negative.   Hematological: Negative.   Psychiatric/Behavioral: Negative.    Outpatient Prescriptions Prior to Visit  Medication Sig Dispense Refill  .  atorvastatin (LIPITOR) 20 MG tablet Take 20 mg by mouth daily.      Marland Kitchen buPROPion (WELLBUTRIN XL) 150 MG 24 hr tablet Take 300 mg by mouth daily.        . cholecalciferol (VITAMIN D) 1000 UNITS tablet Take 1,000 Units by mouth as needed.       . fluticasone (FLOVENT HFA) 110 MCG/ACT inhaler Use 4 puffs three times daily. Take Spacer out and swallow.  1 Inhaler  5  . Multiple Vitamins-Minerals (ONE-A-DAY 50 PLUS PO) Take 1 capsule by mouth as needed.       . sertraline (ZOLOFT) 100 MG tablet Take 100 mg by mouth daily.        Marland Kitchen zolpidem (AMBIEN) 5 MG tablet Take 5 mg by mouth at bedtime as needed.        . hydrocortisone (ANUSOL-HC) 25 MG suppository Place 1 suppository (25 mg total) rectally 2 (two) times daily.  12 suppository  1  . hydrocortisone-pramoxine (ANALPRAM-HC) 2.5-1 % rectal cream Place 1 application rectally 2 (two) times daily.  30 g  0  . Sod Picosulfate-Mag Ox-Cit Acd ([place]) 10-3.5-12 MG-GM-GM PACK Take 1 kit by mouth as directed.  1 each  0   No facility-administered medications prior to visit.  No Known Allergies     Patient Active Problem List   Diagnosis Date Noted  . EOSINOPHILIC ESOPHAGITIS 37/16/9678  . ESOPHAGEAL STRICTURE 03/04/2010  . OTHER DYSPHAGIA 02/11/2010   History  Substance Use Topics  .  Smoking status: Never Smoker   . Smokeless tobacco: Never Used  . Alcohol Use: No   family history includes Diabetes in her mother; Heart disease in her mother. There is no history of Colon cancer.  Objective:   Physical Exam  white female in no acute distress, pleasant blood pressure 104/64 pulse 80 height 5 foot 3 weight 173. HEENT ;nontraumatic normocephalic EOMI PERRLA sclera anicteric, Supple ;no JVD, Cardiovascula;r regular rate and rhythm with S1-S2 no murmur or gallop, Pulmonary; clear bilaterally, Abdomen; is soft nontender nondistended bowel sounds are active there is no palpable mass or hepatosplenomegaly, Rectal ;not done, Psych; mood and affect normal and  appropriate        Assessment & Plan:  #32  48 year old female with history of eosinophilic esophagitis currently on treatment with inhaled fluticasone 3 times daily with persistent dysphagia and now with episodes of prolonged transient food impaction. While this may be due to be an eosinophilic esophagitis need to rule out recurrent distal esophageal stricture.  Plan; continue fluticasone in the form of Flovent 110 mcg 4 puffs 3 times daily (this is with the spacer out and swallowed) Schedule for upper endoscopy with probable Savary dilation with Dr. Fuller Plan. Procedure discussed in detail with the patient and she is agreeable to proceed. She already had an appointment for colonoscopy April 23 with Dr.Stark and endoscopy is scheduled on that same day

## 2013-08-19 NOTE — Progress Notes (Signed)
Reviewed and agree with management plan.  Vinisha Faxon T. Loveda Colaizzi, MD FACG 

## 2013-09-08 ENCOUNTER — Encounter: Payer: Self-pay | Admitting: Gastroenterology

## 2013-09-08 ENCOUNTER — Ambulatory Visit (AMBULATORY_SURGERY_CENTER): Payer: BC Managed Care – PPO | Admitting: Gastroenterology

## 2013-09-08 VITALS — BP 118/69 | HR 66 | Temp 98.4°F | Resp 30 | Ht 63.0 in | Wt 173.0 lb

## 2013-09-08 DIAGNOSIS — K3189 Other diseases of stomach and duodenum: Secondary | ICD-10-CM

## 2013-09-08 DIAGNOSIS — D131 Benign neoplasm of stomach: Secondary | ICD-10-CM

## 2013-09-08 DIAGNOSIS — K319 Disease of stomach and duodenum, unspecified: Secondary | ICD-10-CM

## 2013-09-08 DIAGNOSIS — K200 Eosinophilic esophagitis: Secondary | ICD-10-CM

## 2013-09-08 DIAGNOSIS — K222 Esophageal obstruction: Secondary | ICD-10-CM

## 2013-09-08 DIAGNOSIS — R1319 Other dysphagia: Secondary | ICD-10-CM

## 2013-09-08 DIAGNOSIS — R1314 Dysphagia, pharyngoesophageal phase: Secondary | ICD-10-CM

## 2013-09-08 MED ORDER — FLEET ENEMA 7-19 GM/118ML RE ENEM
1.0000 | ENEMA | Freq: Once | RECTAL | Status: AC
  Administered 2013-09-08: 1 via RECTAL

## 2013-09-08 MED ORDER — SODIUM CHLORIDE 0.9 % IV SOLN: 500.0000 mL | INTRAVENOUS | Status: DC

## 2013-09-08 NOTE — Progress Notes (Signed)
Reviewed prep instructions with pt and husband in the RR

## 2013-09-08 NOTE — Patient Instructions (Signed)
YOU HAD AN ENDOSCOPIC PROCEDURE TODAY AT [facility]: Refer to the procedure report that was given to you for any specific questions about what was found during the examination.  If the procedure report does not answer your questions, please call your gastroenterologist to clarify.  If you requested that your care partner not be given the details of your procedure findings, then the procedure report has been included in a sealed envelope for you to review at your convenience later.  YOU SHOULD EXPECT: Some feelings of bloating in the abdomen. Passage of more gas than usual.  Walking can help get rid of the air that was put into your GI tract during the procedure and reduce the bloating. If you had a lower endoscopy (such as a colonoscopy or flexible sigmoidoscopy) you may notice spotting of blood in your stool or on the toilet paper. If you underwent a bowel prep for your procedure, then you may not have a normal bowel movement for a few days.  DIET: DILATION DIET- SEE HANDOUT  Drink plenty of fluids but you should avoid alcoholic beverages for 24 hours.  ACTIVITY: Your care partner should take you home directly after the procedure.  You should plan to take it easy, moving slowly for the rest of the day.  You can resume normal activity the day after the procedure however you should NOT DRIVE or use heavy machinery for 24 hours (because of the sedation medicines used during the test).    SYMPTOMS TO REPORT IMMEDIATELY: A gastroenterologist can be reached at any hour.  During normal business hours, 8:30 AM to 5:00 PM Monday through Friday, call [phone].  After hours and on weekends, please call the GI answering service at [phone] who will take a message and have the physician on call contact you.   Following upper endoscopy (EGD)  Vomiting of blood or coffee ground material  New chest pain or pain under the shoulder blades  Painful or persistently difficult  swallowing  New shortness of breath  Fever of 100F or higher  Black, tarry-looking stools  FOLLOW UP: If any biopsies were taken you will be contacted by phone or by letter within the next 1-3 weeks.  Call your gastroenterologist if you have not heard about the biopsies in 3 weeks.  Our staff will call the home number listed on your records the next business day following your procedure to check on you and address any questions or concerns that you may have at that time regarding the information given to you following your procedure. This is a courtesy call and so if there is no answer at the home number and we have not heard from you through the emergency physician on call, we will assume that you have returned to your regular daily activities without incident.  SIGNATURES/CONFIDENTIALITY: You and/or your care partner have signed paperwork which will be entered into your electronic medical record.  These signatures attest to the fact that that the information above on your After Visit Summary has been reviewed and is understood.  Full responsibility of the confidentiality of this discharge information lies with you and/or your care-partner.  FOLLOW DILATION DIET TODAY  Await pathology results  Continue Flovent three times daily

## 2013-09-08 NOTE — Op Note (Signed)
[place]  [address] [place], [address]   ENDOSCOPY PROCEDURE REPORT  PATIENT: [name], [name]  MR#: [id number] BIRTHDATE: 1965/12/16 , 72  yrs. old GENDER: Female ENDOSCOPIST: Ladene Artist, MD, [facility] PROCEDURE DATE:  09/08/2013 PROCEDURE:   EGD with dilatation over guidewire and EGD with biopsy  ASA CLASS:   Class II INDICATIONS:dysphagia. MEDICATIONS: MAC sedation, administered by CRNA and propofol (Diprivan) 200mg  IV TOPICAL ANESTHETIC:   Cetacaine Spray DESCRIPTION OF PROCEDURE:   After the risks benefits and alternatives of the procedure were thoroughly explained, informed consent was obtained.  The     endoscope was introduced through the mouth  and advanced to the descending duodenum ,      The instrument was slowly withdrawn as the mucosa was carefully examined.  ESOPHAGUS: Eosinophilic esophagitis with mucosal changes that included longitudinal furrows were found in the lower third of the esophagus and middle third of the esophagus.   A stricture was found at the gastroesophageal junction.  The stenosis was traversable with the endoscope.   The esophagus was otherwise normal. STOMACH: Small nodule was found on the greater curvature of the gastric body.  Multiple biopsies were performed.   The stomach otherwise appeared normal. DUODENUM: The duodenal mucosa showed no abnormalities in the bulb and second portion of the duodenum.     Dilation was then performed at the gastroesphageal junction Dilator:Savary over guidewire with 13, 14 and 15 mm dilators. Reststance:minimal Heme:none  COMPLICATIONS: There were no complications.  ENDOSCOPIC IMPRESSION: 1.   Eosinophilic esophagitis in the lower and middle thirds of the esophagus 2.   Stricture at the gastroesophageal junction 3.   Small nodule on the greater curvature of the gastric body; multiple biopsies  RECOMMENDATIONS: 1.   Post dilation instructions 2.   Await pathology 3.    Continue Flovent tid as directed  eSigned:  Ladene Artist, MD, [facility] 09/08/2013 2:30 PM

## 2013-09-08 NOTE — Progress Notes (Signed)
Called to room to assist during endoscopic procedure.  Patient ID and intended procedure confirmed with present staff. Received instructions for my participation in the procedure from the performing physician.  

## 2013-09-08 NOTE — Progress Notes (Signed)
Report to pacu rn, vss, bbs=clear 

## 2013-09-09 ENCOUNTER — Telehealth: Payer: Self-pay | Admitting: *Deleted

## 2013-09-09 NOTE — Telephone Encounter (Signed)
Number identifier, left message, follow-up  

## 2013-09-14 ENCOUNTER — Encounter: Payer: Self-pay | Admitting: Gastroenterology

## 2013-09-14 ENCOUNTER — Ambulatory Visit (AMBULATORY_SURGERY_CENTER): Payer: BC Managed Care – PPO | Admitting: Gastroenterology

## 2013-09-14 VITALS — BP 105/79 | HR 68 | Temp 98.4°F | Resp 17 | Ht 63.0 in | Wt 173.0 lb

## 2013-09-14 DIAGNOSIS — K921 Melena: Secondary | ICD-10-CM

## 2013-09-14 MED ORDER — SODIUM CHLORIDE 0.9 % IV SOLN: 500.0000 mL | INTRAVENOUS | Status: DC

## 2013-09-14 NOTE — Patient Instructions (Signed)
YOU HAD AN ENDOSCOPIC PROCEDURE TODAY AT THE [facility]: Refer to the procedure report that was given to you for any specific questions about what was found during the examination.  If the procedure report does not answer your questions, please call your gastroenterologist to clarify.  If you requested that your care partner not be given the details of your procedure findings, then the procedure report has been included in a sealed envelope for you to review at your convenience later.  YOU SHOULD EXPECT: Some feelings of bloating in the abdomen. Passage of more gas than usual.  Walking can help get rid of the air that was put into your GI tract during the procedure and reduce the bloating. If you had a lower endoscopy (such as a colonoscopy or flexible sigmoidoscopy) you may notice spotting of blood in your stool or on the toilet paper. If you underwent a bowel prep for your procedure, then you may not have a normal bowel movement for a few days.  DIET: Your first meal following the procedure should be a light meal and then it is ok to progress to your normal diet.  A half-sandwich or bowl of soup is an example of a good first meal.  Heavy or fried foods are harder to digest and may make you feel nauseous or bloated.  Likewise meals heavy in dairy and vegetables can cause extra gas to form and this can also increase the bloating.  Drink plenty of fluids but you should avoid alcoholic beverages for 24 hours.  ACTIVITY: Your care partner should take you home directly after the procedure.  You should plan to take it easy, moving slowly for the rest of the day.  You can resume normal activity the day after the procedure however you should NOT DRIVE or use heavy machinery for 24 hours (because of the sedation medicines used during the test).    SYMPTOMS TO REPORT IMMEDIATELY: A gastroenterologist can be reached at any hour.  During normal business hours, 8:30 AM to 5:00 PM Monday through Friday,  call [phone].  After hours and on weekends, please call the GI answering service at [phone] who will take a message and have the physician on call contact you.   Following lower endoscopy (colonoscopy or flexible sigmoidoscopy):  Excessive amounts of blood in the stool  Significant tenderness or worsening of abdominal pains  Swelling of the abdomen that is new, acute  Fever of 100F or higher    FOLLOW UP: If any biopsies were taken you will be contacted by phone or by letter within the next 1-3 weeks.  Call your gastroenterologist if you have not heard about the biopsies in 3 weeks.  Our staff will call the home number listed on your records the next business day following your procedure to check on you and address any questions or concerns that you may have at that time regarding the information given to you following your procedure. This is a courtesy call and so if there is no answer at the home number and we have not heard from you through the emergency physician on call, we will assume that you have returned to your regular daily activities without incident.  SIGNATURES/CONFIDENTIALITY: You and/or your care partner have signed paperwork which will be entered into your electronic medical record.  These signatures attest to the fact that that the information above on your After Visit Summary has been reviewed and is understood.  Full responsibility of the confidentiality   this discharge information lies with you and/or your care-partner.  Information on hemorrhoids given to you today 

## 2013-09-14 NOTE — Op Note (Signed)
[place]  [address]. [place], [id number]   COLONOSCOPY PROCEDURE REPORT  PATIENT: [name], [name]  MR#: [id number] BIRTHDATE: 08-17-1965 , 66  yrs. old GENDER: Female ENDOSCOPIST: Ladene Artist, MD, [facility] REFERRED PP:[id number] Matthew Saras, M.D. PROCEDURE DATE:  09/14/2013 PROCEDURE:   Colonoscopy, diagnostic First Screening Colonoscopy - Avg.  risk and is 50 yrs.  old or older - No.  Prior Negative Screening - Now for repeat screening. N/A  History of Adenoma - Now for follow-up colonoscopy & has been > or = to 3 yrs.  N/A  Polyps Removed Today? No.  Recommend repeat exam, <10 yrs? No. ASA CLASS:   Class II INDICATIONS:hematochezia. MEDICATIONS: MAC sedation, administered by CRNA and propofol (Diprivan) 300mg  IV DESCRIPTION OF PROCEDURE:   After the risks benefits and alternatives of the procedure were thoroughly explained, informed consent was obtained.  A digital rectal exam revealed external hemorrhoids.   The LB [id number]  endoscope was introduced through the anus and advanced to the cecum, which was identified by both the appendix and ileocecal valve. No adverse events experienced.   The quality of the prep was good, using MoviPrep The instrument was then slowly withdrawn as the colon was fully examined.  COLON FINDINGS: A normal appearing cecum, ileocecal valve, and appendiceal orifice were identified.  The ascending, hepatic flexure, transverse, splenic flexure, descending, sigmoid colon and rectum appeared unremarkable.  No polyps or cancers were seen. Retroflexed views revealed small internal hemorrhoids. The time to cecum=4 minutes 34 seconds.  Withdrawal time=9 minutes 14 seconds. The scope was withdrawn and the procedure completed.  COMPLICATIONS: There were no complications.  ENDOSCOPIC IMPRESSION: 1.  Normal colon 2.  Internal and external hemorrhoids  RECOMMENDATIONS: 1.  High fiber diet with liberal fluid intake. 2.  Plan for a  routine, screening colonoscopy in 10 years. No need for screening FOBT for at least 5 years.  eSigned:  Ladene Artist, MD, [facility] 09/14/2013 1:57 PM

## 2013-09-15 ENCOUNTER — Telehealth: Payer: Self-pay | Admitting: *Deleted

## 2013-09-15 NOTE — Telephone Encounter (Signed)
Left message we called for f/u 

## 2014-01-21 ENCOUNTER — Encounter: Payer: Self-pay | Admitting: Gastroenterology

## 2014-10-27 ENCOUNTER — Other Ambulatory Visit: Payer: Self-pay | Admitting: Gastroenterology

## 2014-10-30 ENCOUNTER — Other Ambulatory Visit: Payer: Self-pay | Admitting: Gastroenterology

## 2014-10-31 ENCOUNTER — Other Ambulatory Visit: Payer: Self-pay | Admitting: Gastroenterology

## 2014-10-31 NOTE — Telephone Encounter (Signed)
Dr Fuller Plan can we refill flovent?

## 2014-10-31 NOTE — Telephone Encounter (Signed)
Pt aware that Dt Fuller Plan was sent a message regarding the prescription for Flovent and we will call her as soon as we get a response.

## 2014-11-03 ENCOUNTER — Other Ambulatory Visit: Payer: Self-pay | Admitting: Gastroenterology

## 2014-11-03 NOTE — Telephone Encounter (Signed)
Pt aware that the prescription was refilled by Dr Fuller Plan on 10/31/14.

## 2014-11-28 ENCOUNTER — Telehealth: Payer: Self-pay | Admitting: Gastroenterology

## 2014-11-28 MED ORDER — FLUTICASONE PROPIONATE HFA 110 MCG/ACT IN AERO: INHALATION_SPRAY | RESPIRATORY_TRACT | Status: DC

## 2014-11-28 NOTE — Telephone Encounter (Signed)
She is already scheduled for a follow up visit on 01/11/15. Prescription refill sent to patient's pharmacy and patient notified.

## 2014-11-28 NOTE — Telephone Encounter (Signed)
Can patient have another refill of Flovent for eosinophilic esophagitis?

## 2014-11-28 NOTE — Telephone Encounter (Signed)
Yes however she is overdue for an annual office visit for more refills

## 2015-01-11 ENCOUNTER — Ambulatory Visit: Payer: Self-pay | Admitting: Gastroenterology

## 2015-02-28 ENCOUNTER — Ambulatory Visit (INDEPENDENT_AMBULATORY_CARE_PROVIDER_SITE_OTHER): Payer: BLUE CROSS/BLUE SHIELD | Admitting: Gastroenterology

## 2015-02-28 ENCOUNTER — Encounter: Payer: Self-pay | Admitting: Gastroenterology

## 2015-02-28 VITALS — BP 118/78 | Ht 63.5 in | Wt 192.0 lb

## 2015-02-28 DIAGNOSIS — R1314 Dysphagia, pharyngoesophageal phase: Secondary | ICD-10-CM

## 2015-02-28 DIAGNOSIS — K200 Eosinophilic esophagitis: Secondary | ICD-10-CM | POA: Diagnosis not present

## 2015-02-28 DIAGNOSIS — R1310 Dysphagia, unspecified: Secondary | ICD-10-CM

## 2015-02-28 DIAGNOSIS — R1319 Other dysphagia: Secondary | ICD-10-CM

## 2015-02-28 MED ORDER — FLUTICASONE PROPIONATE HFA 110 MCG/ACT IN AERO: INHALATION_SPRAY | RESPIRATORY_TRACT | Status: DC

## 2015-02-28 NOTE — Progress Notes (Signed)
    History of Present Illness: This is a 49 year old female returning for follow-up of eosinophilic esophagitis. She ran out of her Flovent inhaler about 2 months ago at over the past 2 weeks she has noted slight difficulty swallowing solid foods on 1 or 2 occasions. She has no other gastrointestinal complaints.  Current Medications, Allergies, Past Medical History, Past Surgical History, Family History and Social History were reviewed in [employer] record.  Physical Exam: General: Well developed, well nourished, no acute distress Head: Normocephalic and atraumatic Eyes:  sclerae anicteric, EOMI Ears: Normal auditory acuity Mouth: No deformity or lesions Lungs: Clear throughout to auscultation Heart: Regular rate and rhythm; no murmurs, rubs or bruits Abdomen: Soft, non tender and non distended. No masses, hepatosplenomegaly or hernias noted. Normal Bowel sounds Musculoskeletal: Symmetrical with no gross deformities  Pulses:  Normal pulses noted Extremities: No clubbing, cyanosis, edema or deformities noted Neurological: Alert oriented x 4, grossly nonfocal Psychological:  Alert and cooperative. Normal mood and affect  Assessment and Recommendations:

## 2015-02-28 NOTE — Assessment & Plan Note (Addendum)
Symptoms were well controlled on Flovent but has had a few episodes of solid food dysphagia since running out last month. Resume Flovent 4 puffs swallowed tid. If her dysphasia symptoms do not improve over the next 1-2 months she is advised to call. REV in 1 year.

## 2015-02-28 NOTE — Patient Instructions (Signed)
We have sent the following prescriptions to your mail in pharmacy:fluticasone inhaler.   If you have not heard from your mail in pharmacy within 1 week or if you have not received your medication in the mail, please contact us at [phone] so we may find out why.  Thank you for choosing me and [facility].  Pricilla Riffle. Dagoberto Ligas., MD., [facility]

## 2015-04-18 ENCOUNTER — Other Ambulatory Visit: Payer: Self-pay | Admitting: Obstetrics and Gynecology

## 2015-04-18 DIAGNOSIS — R928 Other abnormal and inconclusive findings on diagnostic imaging of breast: Secondary | ICD-10-CM

## 2015-04-24 ENCOUNTER — Ambulatory Visit
Admission: RE | Admit: 2015-04-24 | Discharge: 2015-04-24 | Disposition: A | Discharge status: Home / Self Care | Payer: BLUE CROSS/BLUE SHIELD | Source: Ambulatory Visit | Attending: Obstetrics and Gynecology | Admitting: Obstetrics and Gynecology

## 2015-04-24 DIAGNOSIS — R928 Other abnormal and inconclusive findings on diagnostic imaging of breast: Secondary | ICD-10-CM

## 2016-04-04 ENCOUNTER — Ambulatory Visit (INDEPENDENT_AMBULATORY_CARE_PROVIDER_SITE_OTHER): Payer: PRIVATE HEALTH INSURANCE

## 2016-04-04 ENCOUNTER — Ambulatory Visit: Payer: Self-pay | Admitting: Sports Medicine

## 2016-04-04 ENCOUNTER — Ambulatory Visit (INDEPENDENT_AMBULATORY_CARE_PROVIDER_SITE_OTHER): Payer: PRIVATE HEALTH INSURANCE | Admitting: Sports Medicine

## 2016-04-04 ENCOUNTER — Encounter: Payer: Self-pay | Admitting: Sports Medicine

## 2016-04-04 DIAGNOSIS — M79673 Pain in unspecified foot: Secondary | ICD-10-CM | POA: Diagnosis not present

## 2016-04-04 DIAGNOSIS — M722 Plantar fascial fibromatosis: Secondary | ICD-10-CM

## 2016-04-04 DIAGNOSIS — M21622 Bunionette of left foot: Secondary | ICD-10-CM | POA: Diagnosis not present

## 2016-04-04 MED ORDER — TRIAMCINOLONE ACETONIDE 40 MG/ML IJ SUSP: 20.0000 mg | Freq: Once | INTRAMUSCULAR | Status: DC

## 2016-04-04 MED ORDER — DICLOFENAC SODIUM 75 MG PO TBEC: 75.0000 mg | DELAYED_RELEASE_TABLET | Freq: Two times a day (BID) | ORAL | 0 refills | Status: DC

## 2016-04-04 MED ORDER — METHYLPREDNISOLONE 4 MG PO TBPK: ORAL_TABLET | ORAL | 0 refills | Status: DC

## 2016-04-04 NOTE — Progress Notes (Signed)
Subjective: [name] is a 50 y.o. female patient presents to office with complaint of heel pain on the right. Patient admits to post static dyskinesia for >4 months in duration. Patient has treated this problem with massage which hurts and berkinstocks which helps a little however pain especially after getting up to stand pain is starting to become unbearable. Patient also states that she has pain on the Left foot near the base of her pinky to that has started since her Right foot symptoms. Denies any other pedal complaints.   Patient Active Problem List   Diagnosis Date Noted  . Eosinophilic esophagitis 72/01/4708  . ESOPHAGEAL STRICTURE 03/04/2010  . OTHER DYSPHAGIA 02/11/2010    Current Outpatient Prescriptions on File Prior to Visit  Medication Sig Dispense Refill  . buPROPion (WELLBUTRIN XL) 150 MG 24 hr tablet Take 300 mg by mouth daily.      . fluticasone (FLOVENT HFA) 110 MCG/ACT inhaler USE 4 PUFFS 3 TIMES DAILY TAKE SPACE OUTAND SWALLOW 36 g 3  . mirtazapine (REMERON) 30 MG tablet Take 30 mg by mouth at bedtime.    . Multiple Vitamins-Minerals (ONE-A-DAY 50 PLUS PO) Take 1 capsule by mouth as needed.      No current facility-administered medications on file prior to visit.     No Known Allergies  Objective: Physical Exam General: The patient is alert and oriented x3 in no acute distress.  Dermatology: Skin is warm, dry and supple bilateral lower extremities. Nails 1-10 are normal. There is no erythema, edema, no eccymosis, no open lesions present. Integument is otherwise unremarkable.  Vascular: Dorsalis Pedis pulse and Posterior Tibial pulse are 2/4 bilateral. Capillary fill time is immediate to all digits.  Neurological: Grossly intact to light touch with an achilles reflex of +2/5 and a negative Tinel's sign bilateral.  Musculoskeletal: Tenderness to palpation at the medial calcaneal tubercale and through the insertion of the plantar fascia on the right foot. Mild  tenderness to 5th MTPJ and early tailors bunion on left. No pain with compression of calcaneus bilateral. No pain with tuning fork to calcaneus bilateral. No pain with calf compression bilateral. There is decreased Ankle joint range of motion bilateral. All other joints range of motion within normal limits bilateral. Lesser hammertoes. Strength 5/5 in all groups bilateral.   Xray, Right and Left foot:  Normal osseous mineralization. Joint spaces preserved. Mild left 5th met deviation with tailors bunion. Hammertoes bilateral. No fracture/dislocation/boney destruction. Calcaneal spur present with mild thickening of plantar fascia. No other soft tissue abnormalities or radiopaque foreign bodies.   Assessment and Plan: Problem List Items Addressed This Visit    None    Visit Diagnoses    Plantar fasciitis of right foot    -  Primary   Relevant Medications   methylPREDNISolone (MEDROL DOSEPAK) 4 MG TBPK tablet   diclofenac (VOLTAREN) 75 MG EC tablet   triamcinolone acetonide (KENALOG-40) injection 20 mg (Start on 04/04/2016  2:45 PM)   Tailor's bunion of left foot       Relevant Medications   methylPREDNISolone (MEDROL DOSEPAK) 4 MG TBPK tablet   diclofenac (VOLTAREN) 75 MG EC tablet   Pain of foot, unspecified laterality       Relevant Medications   methylPREDNISolone (MEDROL DOSEPAK) 4 MG TBPK tablet   diclofenac (VOLTAREN) 75 MG EC tablet   Other Relevant Orders   DG Foot 2 Views Left   DG Foot 2 Views Right      -Complete examination performed.  -  Xrays reviewed -Discussed with patient in detail the condition of plantar fasciitis and likely compensation foot pain with early tailors bunion on left, how this occurs and general treatment options. Explained both conservative and surgical treatments.  -After oral consent and aseptic prep, injected a mixture containing 1 ml of 2% plain lidocaine, 1 ml 0.5% plain marcaine, 0.5 ml of kenalog 40 and 0.5 ml of dexamethasone phosphate into right  heel. Post-injection care discussed with patient.  -Rx Diclofenac to start after Medrol dose pack is completed -Recommended good supportive shoes (shoe recs given) and advised use of OTC insert. Explained to patient that she needs to have her OLD custom molded orthoses replaced. - Explained in detail the use of the fascial brace which was dispensed at today's visit. -Explained and dispensed to patient daily stretching exercises. -Recommend patient to ice affected area 1-2x daily. -Patient to return to office in 3 weeks for follow up or sooner if problems or questions arise.  Landis Martins, DPM

## 2016-04-04 NOTE — Patient Instructions (Signed)
For tennis shoes recommend:  Kandy Garrison Ascis New balance Saucony Can be purchased at [employer] or [employer]  Vionic  SAS Can be purchased at [employer] or [employer]   For work shoes recommend: [employer] Work [employer]  Can be purchased at a variety of places or Engineer, maintenance ([place])   For casual shoes recommend: Vionic Birkenstock  Can be purchased at [employer] or [employer]    Plantar Fasciitis Rehab Ask your health care provider which exercises are safe for you. Do exercises exactly as told by your health care provider and adjust them as directed. It is normal to feel mild stretching, pulling, tightness, or discomfort as you do these exercises, but you should stop right away if you feel sudden pain or your pain gets worse. Do not begin these exercises until told by your health care provider. Stretching and range of motion exercises These exercises warm up your muscles and joints and improve the movement and flexibility of your foot. These exercises also help to relieve pain. Exercise A: Plantar fascia stretch 1. Sit with your left / right leg crossed over your opposite knee. 2. Hold your heel with one hand with that thumb near your arch. With your other hand, hold your toes and gently pull them back toward the top of your foot. You should feel a stretch on the bottom of your toes or your foot or both. 3. Hold this stretch for__________ seconds. 4. Slowly release your toes and return to the starting position. Repeat __________ times. Complete this exercise __________ times a day. Exercise B: Gastroc, standing 1. Stand with your hands against a wall. 2. Extend your left / right leg behind you, and bend your front knee slightly. 3. Keeping your heels on the floor and keeping your back knee straight, shift your weight toward the wall without arching your back. You should feel a gentle stretch in your left / right calf. 4. Hold this position for __________ seconds. Repeat __________ times.  Complete this exercise __________ times a day. Exercise C: Soleus, standing 1. Stand with your hands against a wall. 2. Extend your left / right leg behind you, and bend your front knee slightly. 3. Keeping your heels on the floor, bend your back knee and slightly shift your weight over the back leg. You should feel a gentle stretch deep in your calf. 4. Hold this position for __________ seconds. Repeat __________ times. Complete this exercise __________ times a day. Exercise D: Gastrocsoleus, standing 1. Stand with the ball of your left / right foot on a step. The ball of your foot is on the walking surface, right under your toes. 2. Keep your other foot firmly on the same step. 3. Hold onto the wall or a railing for balance. 4. Slowly lift your other foot, allowing your body weight to press your heel down over the edge of the step. You should feel a stretch in your left / right calf. 5. Hold this position for __________ seconds. 6. Return both feet to the step. 7. Repeat this exercise with a slight bend in your left / right knee. Repeat __________ times with your left / right knee straight and __________ times with your left / right knee bent. Complete this exercise __________ times a day. Balance exercise This exercise builds your balance and strength control of your arch to help take pressure off your plantar fascia. Exercise E: Single leg stand 1. Without shoes, stand near a railing or in a doorway. You may hold onto  the railing or door frame as needed. 2. Stand on your left / right foot. Keep your big toe down on the floor and try to keep your arch lifted. Do not let your foot roll inward. 3. Hold this position for __________ seconds. 4. If this exercise is too easy, you can try it with your eyes closed or while standing on a pillow. Repeat __________ times. Complete this exercise __________ times a day. This information is not intended to replace advice given to you by your health care  provider. Make sure you discuss any questions you have with your health care provider. Document Released: 05/05/2005 Document Revised: 01/08/2016 Document Reviewed: 03/19/2015 Elsevier Interactive Patient Education  2017 [employer].

## 2016-04-25 ENCOUNTER — Encounter: Payer: Self-pay | Admitting: Sports Medicine

## 2016-04-25 ENCOUNTER — Ambulatory Visit (INDEPENDENT_AMBULATORY_CARE_PROVIDER_SITE_OTHER): Payer: PRIVATE HEALTH INSURANCE | Admitting: Sports Medicine

## 2016-04-25 ENCOUNTER — Ambulatory Visit: Payer: PRIVATE HEALTH INSURANCE | Admitting: Sports Medicine

## 2016-04-25 DIAGNOSIS — M722 Plantar fascial fibromatosis: Secondary | ICD-10-CM

## 2016-04-25 DIAGNOSIS — M21622 Bunionette of left foot: Secondary | ICD-10-CM | POA: Diagnosis not present

## 2016-04-25 DIAGNOSIS — M79673 Pain in unspecified foot: Secondary | ICD-10-CM | POA: Diagnosis not present

## 2016-04-25 NOTE — Progress Notes (Signed)
Subjective: [name] is a 50 y.o. female returns to office for follow up evaluation after Left heel injection for plantar fasciitis, injection #1 administered 3 weeks ago. Patient states that the injection seems to help her pain; pain is no longer present and has stopped stretches. Completed medrol and diclofenac. Patient denies any recent changes in medications or new problems since last visit.   Patient Active Problem List   Diagnosis Date Noted  . Eosinophilic esophagitis 123456  . ESOPHAGEAL STRICTURE 03/04/2010  . OTHER DYSPHAGIA 02/11/2010    Current Outpatient Prescriptions on File Prior to Visit  Medication Sig Dispense Refill  . buPROPion (WELLBUTRIN XL) 150 MG 24 hr tablet Take 300 mg by mouth daily.      Marland Kitchen buPROPion (WELLBUTRIN XL) 300 MG 24 hr tablet     . diclofenac (VOLTAREN) 75 MG EC tablet Take 1 tablet (75 mg total) by mouth 2 (two) times daily. 30 tablet 0  . fluticasone (FLOVENT HFA) 110 MCG/ACT inhaler USE 4 PUFFS 3 TIMES DAILY TAKE SPACE OUTAND SWALLOW 36 g 3  . methylPREDNISolone (MEDROL DOSEPAK) 4 MG TBPK tablet Take 1st as instructed 21 tablet 0  . mirtazapine (REMERON) 30 MG tablet Take 30 mg by mouth at bedtime.    . Multiple Vitamins-Minerals (ONE-A-DAY 50 PLUS PO) Take 1 capsule by mouth as needed.      Current Facility-Administered Medications on File Prior to Visit  Medication Dose Route Frequency Provider Last Rate Last Dose  . triamcinolone acetonide (KENALOG-40) injection 20 mg  20 mg Other Once [employer], DPM        No Known Allergies  Objective:   General:  Alert and oriented x 3, in no acute distress  Dermatology: Skin is warm, dry, and supple bilateral. Nails are within normal limits. There is no lower extremity erythema, no eccymosis, no open lesions present bilateral.   Vascular: Dorsalis Pedis and Posterior Tibial pedal pulses are 2/4 bilateral. + hair growth noted bilateral. Capillary Fill Time is 3 seconds in all digits. No  varicosities, No edema bilateral lower extremities.   Neurological: Sensation grossly intact to light touch with an achilles reflex of +2 and a negative Tinel's sign bilateral. Vibratory, sharp/dull, Semmes Weinstein Monofilament within normal limits.   Musculoskeletal: There is no tenderness to palpation at the medial calcaneal tubercale and through the insertion of the plantar fascia on the Left foot. Asymptomatic tailors bunion L>R.  No pain with compression to calcaneus or application of tuning fork. There is decreased Ankle joint range of motion bilateral. All other joints range of motion  within normal limits bilateral. Strength 5/5 bilateral.   Assessment and Plan: Problem List Items Addressed This Visit    None    Visit Diagnoses    Plantar fasciitis of right foot    -  Primary   Tailor's bunion of left foot       Pain of foot, unspecified laterality          -Complete examination performed.  -Patient to wean from fascial brace as instructed on left -Continue with stretching, icing, good supportive shoes, inserts daily.  Advised no barefoot walking. -Discussed long term care and re-ocurrence -Patient to return to office as needed for follow up or sooner if problems or questions arise. Patient to call when she is ready for a new set of orthotics.   Landis Martins, DPM

## 2016-08-18 ENCOUNTER — Telehealth: Payer: Self-pay | Admitting: Gastroenterology

## 2016-08-18 MED ORDER — FLUTICASONE PROPIONATE HFA 110 MCG/ACT IN AERO: INHALATION_SPRAY | RESPIRATORY_TRACT | 0 refills | Status: DC

## 2016-08-18 NOTE — Telephone Encounter (Signed)
OK to refill until appt 

## 2016-08-18 NOTE — Telephone Encounter (Signed)
Patient has not been seen since 02/2015 and is over due for a follow up visit. She is requesting a refill of fluticasone prescribed for EE until her appt scheduled for 09/15/16. Can we refill this?

## 2016-08-18 NOTE — Telephone Encounter (Signed)
Prescription sent to patient's pharmacy until scheduled appt. 

## 2016-09-15 ENCOUNTER — Encounter: Payer: Self-pay | Admitting: Gastroenterology

## 2016-09-15 ENCOUNTER — Ambulatory Visit (INDEPENDENT_AMBULATORY_CARE_PROVIDER_SITE_OTHER): Payer: 59 | Admitting: Gastroenterology

## 2016-09-15 VITALS — BP 146/76 | HR 72 | Ht 63.5 in | Wt 191.0 lb

## 2016-09-15 DIAGNOSIS — K5900 Constipation, unspecified: Secondary | ICD-10-CM | POA: Diagnosis not present

## 2016-09-15 DIAGNOSIS — K200 Eosinophilic esophagitis: Secondary | ICD-10-CM

## 2016-09-15 MED ORDER — FLUTICASONE PROPIONATE HFA 220 MCG/ACT IN AERO: 2.0000 | INHALATION_SPRAY | Freq: Two times a day (BID) | RESPIRATORY_TRACT | 12 refills | Status: DC

## 2016-09-15 NOTE — Patient Instructions (Addendum)
We have sent the following medications to your pharmacy for you to pick up at your convenience: Flovent 220 mcg 2 puffs swallowed and not inhaled twice daily, rinse mouth and swallow. Do not drink anything 30 minutes prior or afterwards.  Take Colace every day and if that does not improve your constipation, then start Miralax daily.   Thank you for choosing me and [facility].  Pricilla Riffle. Dagoberto Ligas., MD., [facility] Regal

## 2016-09-15 NOTE — Progress Notes (Signed)
    History of Present Illness: This is a 51 year old female with eosinophilic esophagitis and constipation. She has ran out of Flovent and has gradually noticed intermittent episodes of dysphagia that are happening more frequently. She has problems with chronic constipation and generally has a bowel movement once every 2 weeks. She notes fullness and bloating prior bowel movements and she has recently noted lower abdominal pain following bowel movements.  Colonoscopy 08/2013 hemorrhoids, otherwise normal.   EGD 08/2013  1. Eosinophilic esophagitis in the lower and middle thirds of the esophagus 2. Stricture at the gastroesophageal junction 3. Small nodule on the greater curvature of the gastric body; multiple biopsies   Current Medications, Allergies, Past Medical History, Past Surgical History, Family History and Social History were reviewed in [employer] record.  Physical Exam: General: Well developed, well nourished, no acute distress Head: Normocephalic and atraumatic Eyes:  sclerae anicteric, EOMI Ears: Normal auditory acuity Mouth: No deformity or lesions Lungs: Clear throughout to auscultation Heart: Regular rate and rhythm; no murmurs, rubs or bruits Abdomen: Soft, non tender and non distended. No masses, hepatosplenomegaly or hernias noted. Normal Bowel sounds Musculoskeletal: Symmetrical with no gross deformities  Pulses:  Normal pulses noted Extremities: No clubbing, cyanosis, edema or deformities noted Neurological: Alert oriented x 4, grossly nonfocal Psychological:  Alert and cooperative. Normal mood and affect  Assessment and Recommendations:  1. Eosinophilic esophagitis. Mild dysphagia. Flovent 220 mcg 2 puffs swallowed bid. If symptoms not well controlled within 6 weeks will recommend EGD for further evaluation and possible dilation. REV in 6 months.   2. Constipation. Increase fiber and water intake. Colace daily and if not effective add  Miralax daily. If this is not effective she is advised to call.

## 2016-09-28 ENCOUNTER — Telehealth: Payer: Self-pay | Admitting: Internal Medicine

## 2016-09-28 NOTE — Telephone Encounter (Signed)
Patient of [facility] with EOE hx stricture with food impaction. Recently seen in office with worsening dysphagia. Now with chicken and broccoli food impaction. Told to go to [facility] as she will need EGD with endoscopic removal.

## 2016-09-28 NOTE — Telephone Encounter (Signed)
I had heard from the patient in several hours since initial contact. Thus, I called her. Informed me that she vomited up her food bolus on the way to the hospital. No tolerating oral intake. I advised her to stay on clear liquids and contact Dr. Silvio Pate nurse tomorrow. She will need EGD with dilation. I will forward to Dr. Fuller Plan and his nurse.

## 2016-09-29 ENCOUNTER — Telehealth: Payer: Self-pay | Admitting: Gastroenterology

## 2016-09-29 MED ORDER — AMBULATORY NON FORMULARY MEDICATION: 1 refills | Status: DC

## 2016-09-29 NOTE — Telephone Encounter (Signed)
See phone note started by Dr. Henrene Pastor 09/28/16 for details.

## 2016-09-29 NOTE — Telephone Encounter (Signed)
See JP notes from the weekend. Please schedule EGD/dilation. Reinforce need to use Flovent as prescribed. Begin omeprazole 20 mg daily. Avoid meats, bread until dilation.

## 2016-09-29 NOTE — Telephone Encounter (Signed)
Patient notified of new rx and that she will need to get it from [facility].

## 2016-09-29 NOTE — Telephone Encounter (Signed)
Left message for patient to call back  

## 2016-09-29 NOTE — Telephone Encounter (Signed)
Budesonide 2 mg/24ml liquid suspension bid if less expensive than Flovent

## 2016-09-29 NOTE — Addendum Note (Signed)
Addended by: Marlon Pel on: 09/29/2016 03:26 PM   Modules accepted: Orders

## 2016-09-29 NOTE — Telephone Encounter (Signed)
Patient notified of the recommendations and she has been scheduled for pre-visit this week as well as EGD dil on Monday 10/06/16.  Flovent Rx was over $350, is there an acceptable alternative you want to prescribe until EGD next week?

## 2016-10-02 ENCOUNTER — Encounter: Payer: Self-pay | Admitting: Gastroenterology

## 2016-10-02 ENCOUNTER — Ambulatory Visit (AMBULATORY_SURGERY_CENTER): Payer: Self-pay

## 2016-10-02 VITALS — Ht 63.0 in | Wt 186.0 lb

## 2016-10-02 DIAGNOSIS — R1319 Other dysphagia: Secondary | ICD-10-CM

## 2016-10-02 NOTE — Progress Notes (Signed)
Denies allergies to eggs or soy products. Denies complication of anesthesia or sedation. Denies use of weight loss medication. Denies use of O2.   Emmi instructions given for colonoscopy.  

## 2016-10-06 ENCOUNTER — Ambulatory Visit (AMBULATORY_SURGERY_CENTER): Payer: 59 | Admitting: Gastroenterology

## 2016-10-06 ENCOUNTER — Encounter: Payer: Self-pay | Admitting: Gastroenterology

## 2016-10-06 VITALS — BP 118/72 | HR 68 | Temp 98.2°F | Resp 13 | Ht 63.0 in | Wt 186.0 lb

## 2016-10-06 DIAGNOSIS — K222 Esophageal obstruction: Secondary | ICD-10-CM

## 2016-10-06 DIAGNOSIS — R1319 Other dysphagia: Secondary | ICD-10-CM

## 2016-10-06 DIAGNOSIS — R1310 Dysphagia, unspecified: Secondary | ICD-10-CM | POA: Diagnosis not present

## 2016-10-06 DIAGNOSIS — K317 Polyp of stomach and duodenum: Secondary | ICD-10-CM

## 2016-10-06 MED ORDER — SODIUM CHLORIDE 0.9 % IV SOLN: 500.0000 mL | INTRAVENOUS | Status: DC

## 2016-10-06 NOTE — Patient Instructions (Signed)
Impression/Recommendations:  Esophagitis handout given to patient.  Dilation diet handout given to patient.  Clear liquid diet for 2 hours, than advance to soft diet as toleratied.  Resume prio diet tomorrow.  Continue present medications. Await pathology results.  Return to GI office in 6 weeks.  YOU HAD AN ENDOSCOPIC PROCEDURE TODAY AT [facility]:   Refer to the procedure report that was given to you for any specific questions about what was found during the examination.  If the procedure report does not answer your questions, please call your gastroenterologist to clarify.  If you requested that your care partner not be given the details of your procedure findings, then the procedure report has been included in a sealed envelope for you to review at your convenience later.  YOU SHOULD EXPECT: Some feelings of bloating in the abdomen. Passage of more gas than usual.  Walking can help get rid of the air that was put into your GI tract during the procedure and reduce the bloating. If you had a lower endoscopy (such as a colonoscopy or flexible sigmoidoscopy) you may notice spotting of blood in your stool or on the toilet paper. If you underwent a bowel prep for your procedure, you may not have a normal bowel movement for a few days.  Please Note:  You might notice some irritation and congestion in your nose or some drainage.  This is from the oxygen used during your procedure.  There is no need for concern and it should clear up in a day or so.  SYMPTOMS TO REPORT IMMEDIATELY:   Following upper endoscopy (EGD)  Vomiting of blood or coffee ground material  New chest pain or pain under the shoulder blades  Painful or persistently difficult swallowing  New shortness of breath  Fever of 100F or higher  Black, tarry-looking stools  For urgent or emergent issues, a gastroenterologist can be reached at any hour by calling [phone].   DIET:  We do recommend a small  meal at first, but then you may proceed to your regular diet.  Drink plenty of fluids but you should avoid alcoholic beverages for 24 hours.  ACTIVITY:  You should plan to take it easy for the rest of today and you should NOT DRIVE or use heavy machinery until tomorrow (because of the sedation medicines used during the test).    FOLLOW UP: Our staff will call the number listed on your records the next business day following your procedure to check on you and address any questions or concerns that you may have regarding the information given to you following your procedure. If we do not reach you, we will leave a message.  However, if you are feeling well and you are not experiencing any problems, there is no need to return our call.  We will assume that you have returned to your regular daily activities without incident.  If any biopsies were taken you will be contacted by phone or by letter within the next 1-3 weeks.  Please call us at [phone] if you have not heard about the biopsies in 3 weeks.    SIGNATURES/CONFIDENTIALITY: You and/or your care partner have signed paperwork which will be entered into your electronic medical record.  These signatures attest to the fact that that the information above on your After Visit Summary has been reviewed and is understood.  Full responsibility of the confidentiality of this discharge information lies with you and/or your care-partner.

## 2016-10-06 NOTE — Progress Notes (Signed)
Report given to PACU, vss 

## 2016-10-06 NOTE — Progress Notes (Signed)
Called to room to assist during endoscopic procedure.  Patient ID and intended procedure confirmed with present staff. Received instructions for my participation in the procedure from the performing physician.  

## 2016-10-06 NOTE — Op Note (Signed)
[place] Patient Name: [name] Procedure Date: 10/06/2016 9:00 AM MRN: [id number] Endoscopist: Ladene Artist , MD Age: 51 Referring MD:  Date of Birth: 1966-01-10 Gender: Female Account #: [id number] Procedure:                Upper GI endoscopy Indications:              Dysphagia Medicines:                Monitored Anesthesia Care Procedure:                Pre-Anesthesia Assessment:                           - Prior to the procedure, a History and Physical                            was performed, and patient medications and                            allergies were reviewed. The patient's tolerance of                            previous anesthesia was also reviewed. The risks                            and benefits of the procedure and the sedation                            options and risks were discussed with the patient.                            All questions were answered, and informed consent                            was obtained. Prior Anticoagulants: The patient has                            taken no previous anticoagulant or antiplatelet                            agents. ASA Grade Assessment: II - A patient with                            mild systemic disease. After reviewing the risks                            and benefits, the patient was deemed in                            satisfactory condition to undergo the procedure.                           After obtaining informed consent, the endoscope was  passed under direct vision. Throughout the                            procedure, the patient's blood pressure, pulse, and                            oxygen saturations were monitored continuously. The                            Endoscope was introduced through the mouth, and                            advanced to the second part of duodenum. The upper                            GI endoscopy was accomplished without  difficulty.                            The patient tolerated the procedure well. Scope In: Scope Out: Findings:                 Mucosal changes including longitudinal furrows were                            found in the entire esophagus.                           One moderate benign-appearing, intrinsic stenosis                            was found at the gastroesophageal junction. This                            measured 1.2 cm (inner diameter) and was traversed.                            A guidewire was placed and the scope was withdrawn.                            Dilation was performed with a Savary dilator with                            no resistance at 13 mm. A guidewire was placed and                            the scope was withdrawn. Dilations were performed                            with a Savary dilator with mild resistance at 14                            mm, 15 mm and 16 mm. No heme noted.  The exam of the esophagus was otherwise normal.                           Multiple 3 to 4 mm sessile polyps with no bleeding                            and no stigmata of recent bleeding were found in                            the gastric fundus and in the gastric body.                            Biopsies were taken with a cold forceps for                            histology.                           The exam of the stomach was otherwise normal.                           The duodenal bulb and second portion of the                            duodenum were normal. Complications:            No immediate complications. Estimated Blood Loss:     Estimated blood loss was minimal. Impression:               - Esophageal mucosal changes secondary to                            eosinophilic esophagitis.                           - Benign-appearing esophageal stenosis. Dilated.                           - Multiple gastric polyps. Biopsied.                           -  Normal duodenal bulb and second portion of the                            duodenum. Recommendation:           - Patient has a contact number available for                            emergencies. The signs and symptoms of potential                            delayed complications were discussed with the                            patient. Return to normal activities tomorrow.  Written discharge instructions were provided to the                            patient.                           - Clear liquid diet for 2 hours, then advance as                            tolerated to soft diet today. Resume prior diet                            tomorrow.                           - Continue present medications.                           - Await pathology results.                           - Return to GI office in 6 weeks. Ladene Artist, MD 10/06/2016 9:26:32 AM This report has been signed electronically.

## 2016-10-07 ENCOUNTER — Telehealth: Payer: Self-pay | Admitting: *Deleted

## 2016-10-07 NOTE — Telephone Encounter (Signed)
  Follow up Call-  Call back number 10/06/2016  Post procedure Call Back phone  # [phone]  Permission to leave phone message Yes  Some recent data might be hidden     Patient questions:  Do you have a fever, pain , or abdominal swelling? No. Pain Score  0 *  Have you tolerated food without any problems? Yes.    Have you been able to return to your normal activities? Yes.    Do you have any questions about your discharge instructions: Diet   No. Medications  No. Follow up visit  No.  Do you have questions or concerns about your Care? No.  Actions: * If pain score is 4 or above: No action needed, pain <4.

## 2016-10-13 ENCOUNTER — Encounter: Payer: Self-pay | Admitting: Gastroenterology

## 2017-01-05 ENCOUNTER — Ambulatory Visit: Payer: 59 | Admitting: Gastroenterology

## 2017-02-25 ENCOUNTER — Encounter: Payer: Self-pay | Admitting: Gastroenterology

## 2017-02-25 ENCOUNTER — Ambulatory Visit (INDEPENDENT_AMBULATORY_CARE_PROVIDER_SITE_OTHER): Payer: 59 | Admitting: Gastroenterology

## 2017-02-25 VITALS — BP 128/78 | HR 81 | Ht 63.0 in | Wt 174.8 lb

## 2017-02-25 DIAGNOSIS — R1310 Dysphagia, unspecified: Secondary | ICD-10-CM | POA: Diagnosis not present

## 2017-02-25 DIAGNOSIS — R1319 Other dysphagia: Secondary | ICD-10-CM

## 2017-02-25 DIAGNOSIS — K200 Eosinophilic esophagitis: Secondary | ICD-10-CM

## 2017-02-25 MED ORDER — FLUTICASONE PROPIONATE HFA 220 MCG/ACT IN AERO: INHALATION_SPRAY | RESPIRATORY_TRACT | 1 refills | Status: DC

## 2017-02-25 NOTE — Progress Notes (Signed)
    History of Present Illness: This is a 51 year old female who returns for follow-up of recurrent dysphagia. She did not fill prescriptions for budesonide or Flovent to manage eosinophilic esophagitis as the prescriptions were expensive. She did well for a few months. Her intermittent solid food dysphagia has returned over the past 2 months. She feels that her dysphasia symptoms worsen with milk products.  EGD 09/2016 - Esophageal mucosal changes secondary to eosinophilic esophagitis. - Benign-appearing esophageal stenosis. Dilated. - Multiple gastric polyps. Biopsied. - Normal duodenal bulb and second portion of the duodenum.  Current Medications, Allergies, Past Medical History, Past Surgical History, Family History and Social History were reviewed in [employer] record.  Physical Exam: General: Well developed, well nourished, no acute distress Head: Normocephalic and atraumatic Eyes:  sclerae anicteric, EOMI Ears: Normal auditory acuity Mouth: No deformity or lesions Lungs: Clear throughout to auscultation Heart: Regular rate and rhythm; no murmurs, rubs or bruits Abdomen: Soft, non tender and non distended. No masses, hepatosplenomegaly or hernias noted. Normal Bowel sounds Rectal: not done Musculoskeletal: Symmetrical with no gross deformities  Pulses:  Normal pulses noted Extremities: No clubbing, cyanosis, edema or deformities noted Neurological: Alert oriented x 4, grossly nonfocal Psychological:  Alert and cooperative. Normal mood and affect  Assessment and Recommendations:  1. Eosinophilic esophagitis with worsening dysphagia. Lactose-free or at least low lactose diet for the next 4-6 weeks. Flovent 220 mg 2 puffs bid. Omeprazole 20 mg daily. We discussed the strategy of medications and dietary changes to help modify the disease course and to prevent or reduce the need repeat endoscopies with dilation. REV in 2 months.

## 2017-02-25 NOTE — Patient Instructions (Signed)
We have sent the following medications to your pharmacy for you to pick up at your convenience: Flovent 220 mcg 2 puffs swallowed, not inhaled twice daily. Rinse mouth and swallow, do not drink anything 30 minutes prior or 30 minutes afterwards. This treatment is 8 weeks.  Thank you for choosing me and [facility].  Pricilla Riffle. Dagoberto Ligas., MD., [facility] Regal

## 2017-03-03 ENCOUNTER — Telehealth: Payer: Self-pay

## 2017-03-03 MED ORDER — OMEPRAZOLE 20 MG PO CPDR: 20.0000 mg | DELAYED_RELEASE_CAPSULE | Freq: Every day | ORAL | 3 refills | Status: DC

## 2017-03-03 NOTE — Telephone Encounter (Signed)
Left a message for patient to return my call. 

## 2017-03-03 NOTE — Telephone Encounter (Signed)
-----   Message from Ladene Artist, MD sent at 02/25/2017  5:52 PM EDT ----- Omeprazole 20 mg po daily, refills for 1 year. This can help to improve eosinophilic esophagitis and I would like her to take this as a long term daily medication. Flovent for 8 weeks. REV in 8 weeks.

## 2017-03-03 NOTE — Telephone Encounter (Signed)
Informed patient of Dr. Lynne Leader recommendations to start omeprazole 20 mg daily long term. Patient states to send rx to mail in pharmacy. Rx sent to [facility] for 90 day supply. Patient is currently on Flovent x 8 weeks and patient has follow up scheduled for 8 weeks.

## 2017-03-12 ENCOUNTER — Telehealth: Payer: Self-pay | Admitting: Gastroenterology

## 2017-03-12 NOTE — Telephone Encounter (Signed)
Left a message on doctor's line giving verbal authorization for omeprazole 20 mg daily and to call back with any other questions.

## 2017-03-19 ENCOUNTER — Telehealth: Payer: Self-pay | Admitting: Gastroenterology

## 2017-03-19 NOTE — Telephone Encounter (Signed)
Attempted to do PA online on cover my meds and it states this drug is covered under patient's plan. Patient returned my call and I informed her of this. Patient states she has been online herself and on the phone with [facility]. Patient states she called them and they told her the dosage of the Flovent was the problem. That it needed a PA and they would fax a form to our office.Informed patient that I will call [facility] myself and have them fax me the form to fill out.

## 2017-03-19 NOTE — Telephone Encounter (Signed)
Left a message for patient to return my call. 

## 2017-03-20 NOTE — Telephone Encounter (Signed)
Spoke with [employer] and attempted PA for Flovent. Linda at [employer] that [employer] is covered through her plan and will run a test claim through the pharmacy and see. Vaughan Basta states that actually the issue at the [facility] drug pharmacy was resolved and that the patient picked up the medication yesterday and cannot get it refilled again until 04/10/17. Informed patient and asked if she picked up medication yesterday. Patient states she did not pick up the medicine yet because the pharmacist told her that they would have to order the Flovent 220 mcg and that they only have the Flovent 110 mcg. Also that that it may be cheaper with 3 month supply through [facility]. Patient states she will go by the pharmacy today to discuss this in person with them and then call me back.

## 2017-03-23 NOTE — Telephone Encounter (Signed)
Patient states she went by the pharmacy on Friday and they once again told her that the Flovent needs a PA and is over 300 dollars. Patient asked if I can call [facility] again to check. Called [facility] and they state they once again that the medication is covered and does not need a PA. Sharyn Lull from [employer] states patient has a deductible that has not been met and any prescription would cost this. Called patient and informed her and gave her the customer assistance number provided by Sharyn Lull at [employer]. Patient states she will call me once she has paid her deductible and picked up the prescription.

## 2017-04-28 ENCOUNTER — Ambulatory Visit: Payer: 59 | Admitting: Gastroenterology

## 2017-07-03 ENCOUNTER — Telehealth: Payer: Self-pay | Admitting: Gastroenterology

## 2017-07-03 MED ORDER — FLUTICASONE PROPIONATE HFA 220 MCG/ACT IN AERO: INHALATION_SPRAY | RESPIRATORY_TRACT | 1 refills | Status: DC

## 2017-07-03 NOTE — Telephone Encounter (Signed)
Prescription sent to patient's pharmacy and patient notified. Patient verbalized understanding. 

## 2017-09-21 ENCOUNTER — Other Ambulatory Visit: Payer: Self-pay | Admitting: Gastroenterology

## 2017-10-27 ENCOUNTER — Other Ambulatory Visit: Payer: Self-pay | Admitting: Gastroenterology

## 2017-11-25 ENCOUNTER — Other Ambulatory Visit: Payer: Self-pay | Admitting: Gastroenterology

## 2017-12-14 ENCOUNTER — Other Ambulatory Visit: Payer: Self-pay

## 2017-12-14 ENCOUNTER — Encounter (HOSPITAL_COMMUNITY): Payer: Self-pay | Admitting: Emergency Medicine

## 2017-12-14 ENCOUNTER — Encounter (HOSPITAL_COMMUNITY)
Admission: EM | Disposition: A | Discharge status: Home / Self Care | Payer: Self-pay | Source: Home / Self Care | Attending: Emergency Medicine

## 2017-12-14 ENCOUNTER — Ambulatory Visit (HOSPITAL_COMMUNITY)
Admission: EM | Admit: 2017-12-14 | Discharge: 2017-12-14 | Disposition: A | Discharge status: Home / Self Care | Payer: 59 | Attending: Internal Medicine | Admitting: Internal Medicine

## 2017-12-14 DIAGNOSIS — K228 Other specified diseases of esophagus: Secondary | ICD-10-CM | POA: Insufficient documentation

## 2017-12-14 DIAGNOSIS — Z8719 Personal history of other diseases of the digestive system: Secondary | ICD-10-CM | POA: Diagnosis not present

## 2017-12-14 DIAGNOSIS — R1310 Dysphagia, unspecified: Secondary | ICD-10-CM | POA: Diagnosis not present

## 2017-12-14 DIAGNOSIS — Z79899 Other long term (current) drug therapy: Secondary | ICD-10-CM | POA: Insufficient documentation

## 2017-12-14 DIAGNOSIS — T18128A Food in esophagus causing other injury, initial encounter: Secondary | ICD-10-CM | POA: Diagnosis present

## 2017-12-14 DIAGNOSIS — F419 Anxiety disorder, unspecified: Secondary | ICD-10-CM | POA: Insufficient documentation

## 2017-12-14 DIAGNOSIS — K219 Gastro-esophageal reflux disease without esophagitis: Secondary | ICD-10-CM | POA: Diagnosis not present

## 2017-12-14 DIAGNOSIS — W44F3XA Food entering into or through a natural orifice, initial encounter: Secondary | ICD-10-CM | POA: Insufficient documentation

## 2017-12-14 DIAGNOSIS — Z9851 Tubal ligation status: Secondary | ICD-10-CM | POA: Diagnosis not present

## 2017-12-14 DIAGNOSIS — K200 Eosinophilic esophagitis: Secondary | ICD-10-CM | POA: Insufficient documentation

## 2017-12-14 DIAGNOSIS — K449 Diaphragmatic hernia without obstruction or gangrene: Secondary | ICD-10-CM | POA: Diagnosis not present

## 2017-12-14 DIAGNOSIS — F329 Major depressive disorder, single episode, unspecified: Secondary | ICD-10-CM | POA: Diagnosis not present

## 2017-12-14 DIAGNOSIS — Z8249 Family history of ischemic heart disease and other diseases of the circulatory system: Secondary | ICD-10-CM | POA: Diagnosis not present

## 2017-12-14 DIAGNOSIS — X58XXXA Exposure to other specified factors, initial encounter: Secondary | ICD-10-CM | POA: Insufficient documentation

## 2017-12-14 DIAGNOSIS — R0789 Other chest pain: Secondary | ICD-10-CM | POA: Insufficient documentation

## 2017-12-14 DIAGNOSIS — D509 Iron deficiency anemia, unspecified: Secondary | ICD-10-CM | POA: Diagnosis not present

## 2017-12-14 DIAGNOSIS — E785 Hyperlipidemia, unspecified: Secondary | ICD-10-CM | POA: Insufficient documentation

## 2017-12-14 DIAGNOSIS — K222 Esophageal obstruction: Secondary | ICD-10-CM | POA: Insufficient documentation

## 2017-12-14 DIAGNOSIS — Z833 Family history of diabetes mellitus: Secondary | ICD-10-CM | POA: Insufficient documentation

## 2017-12-14 HISTORY — PX: ESOPHAGOGASTRODUODENOSCOPY: SHX5428

## 2017-12-14 LAB — I-STAT CHEM 8, ED
BUN: 19 mg/dL (ref 6–20)
CREATININE: 0.8 mg/dL (ref 0.44–1.00)
Calcium, Ion: 1.15 mmol/L (ref 1.15–1.40)
Chloride: 107 mmol/L (ref 98–111)
GLUCOSE: 101 mg/dL — AB (ref 70–99)
HCT: 42 % (ref 36.0–46.0)
Hemoglobin: 14.3 g/dL (ref 12.0–15.0)
POTASSIUM: 3.6 mmol/L (ref 3.5–5.1)
Sodium: 144 mmol/L (ref 135–145)
TCO2: 26 mmol/L (ref 22–32)

## 2017-12-14 SURGERY — EGD (ESOPHAGOGASTRODUODENOSCOPY)
Anesthesia: Moderate Sedation

## 2017-12-14 MED ORDER — MIDAZOLAM HCL 5 MG/ML IJ SOLN
INTRAMUSCULAR | Status: AC
  Filled 2017-12-14: qty 3

## 2017-12-14 MED ORDER — DIPHENHYDRAMINE HCL 50 MG/ML IJ SOLN
INTRAMUSCULAR | Status: AC
  Filled 2017-12-14: qty 1

## 2017-12-14 MED ORDER — PANTOPRAZOLE SODIUM 40 MG PO TBEC: 40.0000 mg | DELAYED_RELEASE_TABLET | Freq: Every day | ORAL | 1 refills | Status: DC

## 2017-12-14 MED ORDER — FENTANYL CITRATE (PF) 100 MCG/2ML IJ SOLN
INTRAMUSCULAR | Status: AC
  Filled 2017-12-14: qty 4

## 2017-12-14 MED ORDER — SODIUM CHLORIDE 0.9 % IV SOLN
INTRAVENOUS | Status: DC
  Administered 2017-12-14: 21:00:00 via INTRAVENOUS

## 2017-12-14 MED ORDER — MIDAZOLAM HCL 5 MG/5ML IJ SOLN
INTRAMUSCULAR | Status: DC | PRN
  Administered 2017-12-14 (×2): 2 mg via INTRAVENOUS
  Administered 2017-12-14 (×2): 1 mg via INTRAVENOUS
  Administered 2017-12-14 (×2): 2 mg via INTRAVENOUS

## 2017-12-14 MED ORDER — FENTANYL CITRATE (PF) 100 MCG/2ML IJ SOLN
INTRAMUSCULAR | Status: DC | PRN
  Administered 2017-12-14 (×4): 25 ug via INTRAVENOUS

## 2017-12-14 NOTE — Discharge Instructions (Addendum)
Follow-up with your stomach doctor.  They will call you with an appointment.  Start taking her Protonix more

## 2017-12-14 NOTE — ED Provider Notes (Signed)
[facility] DEPT Provider Note   CSN: [id number] Arrival date & time: 12/14/17  1955     History   Chief Complaint Chief Complaint  Patient presents with  . food bolus    HPI [name] is a 52 y.o. female.  Patient complains of food impaction.  This happened before  The history is provided by the patient. No language interpreter was used.  Illness  This is a new problem. The current episode started 3 to 5 hours ago. The problem occurs constantly. Associated symptoms include chest pain. Pertinent negatives include no abdominal pain and no headaches. Nothing aggravates the symptoms. Nothing relieves the symptoms. She has tried nothing for the symptoms.    Past Medical History:  Diagnosis Date  . Anxiety   . Depression   . Eosinophilic esophagitis 9476  . Esophageal stricture   . Esophageal tear   . Fundic gland polyps of stomach, benign   . GERD (gastroesophageal reflux disease)   . Hemorrhoids   . Hyperlipidemia   . Iron deficiency anemia   . Microcytic hypochromic anemia   . Stomach ulcer 2008    Patient Active Problem List   Diagnosis Date Noted  . Food impaction of esophagus   . Esophageal obstruction due to food impaction   . Eosinophilic esophagitis 54/65/0354  . ESOPHAGEAL STRICTURE 03/04/2010  . OTHER DYSPHAGIA 02/11/2010    Past Surgical History:  Procedure Laterality Date  . CESAREAN SECTION  [id number]  . DILATION AND CURETTAGE OF UTERUS    . KNEE SURGERY  1986  . TUBAL LIGATION       OB History   None      Home Medications    Prior to Admission medications   Medication Sig Start Date End Date Taking? Authorizing Provider  atorvastatin (LIPITOR) 40 MG tablet Take 40 mg by mouth daily. 10/27/17  Yes [provider]  buPROPion (WELLBUTRIN XL) 300 MG 24 hr tablet Take 300 mg by mouth daily.  02/26/16  Yes [provider]  fluticasone (FLOVENT HFA) 220 MCG/ACT inhaler PLEASE SEE ATTACHED FOR  DETAILED DIRECTIONS Patient taking differently: Inhale 1 puff into the lungs daily as needed (sob and wheezing). PLEASE SEE ATTACHED FOR DETAILED DIRECTIONS 10/27/17  Yes Ladene Artist, MD  mirtazapine (REMERON) 30 MG tablet Take 30 mg by mouth at bedtime. 01/31/15  Yes [provider]  D3-50 50000 units capsule Take 50,000 Units by mouth once a week. 10/27/17   [provider]  omeprazole (PRILOSEC) 20 MG capsule Take 1 capsule (20 mg total) by mouth daily. Patient not taking: Reported on 12/14/2017 03/03/17   Ladene Artist, MD  pantoprazole (PROTONIX) 40 MG tablet Take 1 tablet (40 mg total) by mouth daily. 12/14/17   Milton Ferguson, MD    Family History Family History  Problem Relation Age of Onset  . Heart disease Mother   . Diabetes Mother   . Colon cancer Neg Hx   . Esophageal cancer Neg Hx   . Stomach cancer Neg Hx   . Rectal cancer Neg Hx     Social History Social History   Tobacco Use  . Smoking status: Never Smoker  . Smokeless tobacco: Never Used  Substance Use Topics  . Alcohol use: No  . Drug use: No     Allergies   Patient has no known allergies.   Review of Systems Review of Systems  Constitutional: Negative for appetite change and fatigue.  HENT: Negative for congestion,  ear discharge and sinus pressure.   Eyes: Negative for discharge.  Respiratory: Negative for cough.   Cardiovascular: Positive for chest pain.  Gastrointestinal: Negative for abdominal pain and diarrhea.  Genitourinary: Negative for frequency and hematuria.  Musculoskeletal: Negative for back pain.  Skin: Negative for rash.  Neurological: Negative for seizures and headaches.  Psychiatric/Behavioral: Negative for hallucinations.     Physical Exam Updated Vital Signs BP 123/65   Pulse 89   Temp 97.7 F (36.5 C) (Oral)   Resp 15   Ht 5\' 4"  (1.626 m)   Wt 86.2 kg (190 lb)   SpO2 98%   BMI 32.61 kg/m   Physical Exam  Constitutional: She is oriented to  person, place, and time. She appears well-developed.  HENT:  Head: Normocephalic.  Patient unable to swallow her own saliva  Eyes: Conjunctivae and EOM are normal. No scleral icterus.  Neck: Neck supple. No thyromegaly present.  Cardiovascular: Normal rate and regular rhythm. Exam reveals no gallop and no friction rub.  No murmur heard. Pulmonary/Chest: No stridor. She has no wheezes. She has no rales. She exhibits no tenderness.  Abdominal: She exhibits no distension. There is no tenderness. There is no rebound.  Musculoskeletal: Normal range of motion. She exhibits no edema.  Lymphadenopathy:    She has no cervical adenopathy.  Neurological: She is oriented to person, place, and time. She exhibits normal muscle tone. Coordination normal.  Skin: No rash noted. No erythema.  Psychiatric: She has a normal mood and affect. Her behavior is normal.     ED Treatments / Results  Labs (all labs ordered are listed, but only abnormal results are displayed) Labs Reviewed  I-STAT CHEM 8, ED - Abnormal; Notable for the following components:      Result Value   Glucose, Bld 101 (*)    All other components within normal limits    EKG None  Radiology No results found.  Procedures Procedures (including critical care time)  Medications Ordered in ED Medications - No data to display   Initial Impression / Assessment and Plan / ED Course  I have reviewed the triage vital signs and the nursing notes.  Pertinent labs & imaging results that were available during my care of the patient were reviewed by me and considered in my medical decision making (see chart for details).     Patient with food impaction in esophagus.  She was seen by gastroenterology and they remove the obstruction to her esophagus.  She will be discharged with Protonix and follow-up with GI  Final Clinical Impressions(s) / ED Diagnoses   Final diagnoses:  Food impaction of esophagus, initial encounter    ED  Discharge Orders        Ordered    pantoprazole (PROTONIX) 40 MG tablet  Daily     12/14/17 2319       Milton Ferguson, MD 12/14/17 2325

## 2017-12-14 NOTE — Op Note (Addendum)
[facility] Patient Name: [name] Procedure Date: 12/14/2017 MRN: [id number] Attending MD: Jerene Bears , MD Date of Birth: Sep 09, 1965 CSN: [id number] Age: 52 Admit Type: Outpatient Procedure:                Upper GI endoscopy Indications:              Foreign body in the esophagus Providers:                Lajuan Lines. Hilarie Fredrickson, MD, Angus Seller, William Dalton,                            Technician Referring MD:             Maudry Diego MD, MD Medicines:                Midazolam 10 mg IV, Fentanyl 100 micrograms IV Complications:            No immediate complications. Estimated Blood Loss:     Estimated blood loss: none. Procedure:                Pre-Anesthesia Assessment:                           - Prior to the procedure, a History and Physical                            was performed, and patient medications and                            allergies were reviewed. The patient's tolerance of                            previous anesthesia was also reviewed. The risks                            and benefits of the procedure and the sedation                            options and risks were discussed with the patient.                            All questions were answered, and informed consent                            was obtained. Prior Anticoagulants: The patient has                            taken no previous anticoagulant or antiplatelet                            agents. ASA Grade Assessment: II - A patient with                            mild systemic disease. After reviewing the risks  and benefits, the patient was deemed in                            satisfactory condition to undergo the procedure.                           After obtaining informed consent, the endoscope was                            passed under direct vision. Throughout the                            procedure, the patient's blood pressure, pulse, and                      oxygen saturations were monitored continuously. The                            GIF-H190 ([id number]) Olympus adult endoscope was                            introduced through the mouth, and advanced to the                            second part of duodenum. The upper GI endoscopy was                            accomplished without difficulty. The patient                            tolerated the procedure well. Scope In: Scope Out: Findings:      Food was found in the lower third of the esophagus. The scope was able       to be gently advanced around the food bolus and once confirmed safe, the       food bolus was carefully advanced into the stomach. At the end the       esophagus was clear of all food.      Mucosal changes including ringed esophagus and was found in the middle       third of the esophagus and in the lower third of the esophagus. GE       junction stenosis/stricture.      A small hiatal hernia was present.      No gross lesions were noted in the entire examined stomach. Food/fluid       material in gastric body.      The examined duodenum was normal. Impression:               - Food in the lower third of the esophagus. Removal                            was successful.                           - Esophageal mucosal changes consistent with  eosinophilic esophagitis.                           - Small hiatal hernia.                           - No gross lesions in the stomach.                           - Normal examined duodenum. Moderate Sedation:      Moderate (conscious) sedation was administered by the endoscopy nurse       and supervised by the endoscopist. The following parameters were       monitored: oxygen saturation, heart rate, blood pressure, and response       to care. Total physician intraservice time was 11 minutes. Recommendation:           - Patient has a contact number available for                             emergencies. The signs and symptoms of potential                            delayed complications were discussed with the                            patient. Return to normal activities tomorrow.                            Written discharge instructions were provided to the                            patient.                           - Liquid diet tonight. Soft diet tomorrow.                           - Continue present medications. Add pantoprazole 40                            mg once daily x 4 weeks, if no improvement then                            increase to twice daily.                           - Will likely need full course of either                            fluticasone or budesonide slurry (can see if PPI                            responsive).                           - Follow-up with Dr. Fuller Plan  and will need to arrange                            EGD for dilation. Procedure Code(s):        --- Professional ---                           [phone], Esophagogastroduodenoscopy, flexible,                            transoral; with removal of foreign body(s)                           99152, 59, Moderate sedation services provided by                            the same physician or other qualified health care                            professional performing the diagnostic or                            therapeutic service that the sedation supports,                            requiring the presence of an independent trained                            observer to assist in the monitoring of the                            patient's level of consciousness and physiological                            status; initial 15 minutes of intraservice time,                            patient age 61 years or older Diagnosis Code(s):        --- Professional ---                           [phone], Food in esophagus causing other injury,                            initial encounter                            K22.8, Other specified diseases of esophagus                           K44.9, Diaphragmatic hernia without obstruction or                            gangrene  T18.108A, Unspecified foreign body in esophagus                            causing other injury, initial encounter CPT copyright 2017 American Medical Association. All rights reserved. The codes documented in this report are preliminary and upon coder review may  be revised to meet current compliance requirements. Jerene Bears, MD 12/14/2017 10:42:07 PM This report has been signed electronically. Number of Addenda: 0

## 2017-12-14 NOTE — ED Triage Notes (Signed)
Pt presents form home after having a piece of pork chop stuck in throat. Pt not able to swallow saliva at this time. Pt reports incident occurred at 1830.

## 2017-12-14 NOTE — Consult Note (Signed)
Consultation  Referring Provider:  Dr. Consepcion [facility]) Primary Care Physician:  Molli Posey, MD Primary Gastroenterologist:  Fuller Plan  Reason for Consultation:  Esophageal food impaction         HPI:   [name] is a 52 y.o. female with PMH of EoE, last EGD and dilation May 2018 and history of recurrent dysphagia who called me as the on-call GI MD with acute food impaction.  Started about 6 pm while eating pork for dinner.  Feels food is stuck in her chest.  Causing anxiousness.  Chest discomfort.  No shortness of breath.  In the last hour she feels that swallowing has become easier but she is not certain that the food bolus has passed  She has not been on PPI therapy.  She takes swallowed fluticasone several days per week.  Most recently she has noticed dysphagia with solids.  No abdominal pain.  Past Medical History:  Diagnosis Date  . Anxiety   . Depression   . Eosinophilic esophagitis 1696  . Esophageal stricture   . Esophageal tear   . Fundic gland polyps of stomach, benign   . GERD (gastroesophageal reflux disease)   . Hemorrhoids   . Hyperlipidemia   . Iron deficiency anemia   . Microcytic hypochromic anemia   . Stomach ulcer 2008    Past Surgical History:  Procedure Laterality Date  . CESAREAN SECTION  [id number]  . DILATION AND CURETTAGE OF UTERUS    . KNEE SURGERY  1986  . TUBAL LIGATION      Family History  Problem Relation Age of Onset  . Heart disease Mother   . Diabetes Mother   . Colon cancer Neg Hx   . Esophageal cancer Neg Hx   . Stomach cancer Neg Hx   . Rectal cancer Neg Hx      Social History   Tobacco Use  . Smoking status: Never Smoker  . Smokeless tobacco: Never Used  Substance Use Topics  . Alcohol use: No  . Drug use: No    Prior to Admission medications   Medication Sig Start Date End Date Taking? Authorizing Provider  buPROPion (WELLBUTRIN XL) 300 MG 24 hr tablet  02/26/16   [provider]  fluticasone  (FLOVENT HFA) 220 MCG/ACT inhaler PLEASE SEE ATTACHED FOR DETAILED DIRECTIONS 10/27/17   Ladene Artist, MD  mirtazapine (REMERON) 30 MG tablet Take 30 mg by mouth at bedtime. 01/31/15   [provider]  Multiple Vitamins-Minerals (ONE-A-DAY 50 PLUS PO) Take 1 capsule by mouth as needed.     [provider]  omeprazole (PRILOSEC) 20 MG capsule Take 1 capsule (20 mg total) by mouth daily. 03/03/17   Ladene Artist, MD    Current Facility-Administered Medications  Medication Dose Route Frequency Provider Last Rate Last Dose  . 0.9 %  sodium chloride infusion  500 mL Intravenous Continuous Ladene Artist, MD      . triamcinolone acetonide (KENALOG-40) injection 20 mg  20 mg Other Once Landis Martins, DPM       Current Outpatient Medications  Medication Sig Dispense Refill  . buPROPion (WELLBUTRIN XL) 300 MG 24 hr tablet     . fluticasone (FLOVENT HFA) 220 MCG/ACT inhaler PLEASE SEE ATTACHED FOR DETAILED DIRECTIONS 12 Inhaler 0  . mirtazapine (REMERON) 30 MG tablet Take 30 mg by mouth at bedtime.    . Multiple Vitamins-Minerals (ONE-A-DAY 50 PLUS PO) Take 1 capsule by mouth as needed.     Marland Kitchen  omeprazole (PRILOSEC) 20 MG capsule Take 1 capsule (20 mg total) by mouth daily. 90 capsule 3    Allergies as of 12/14/2017  . (No Known Allergies)     Review of Systems:    As per HPI, otherwise negative    Physical Exam:  Vital signs in last 24 hours:   BP (!) 152/94 (BP Location: Right Arm)   Pulse 76   Temp 98.6 F (37 C) (Oral)   Resp 16   Ht 5\' 4"  (1.626 m)   Wt 190 lb (86.2 kg)   SpO2 98%   BMI 32.61 kg/m   Gen: awake, alert, NAD HEENT: anicteric, op clear CV: RRR, no mrg Pulm: CTA b/l Abd: soft, NT/ND, +BS throughout Ext: no c/c/e Neuro: nonfocal     PREVIOUS ENDOSCOPIES:            EGD 09/2016 dilation to 16 mm, EGD 08/2103 with dilation, EGD 01/2010   Impression / Plan:  52 yo with PMH of EoE presenting to the ER with acute esophageal food  impaction  1. EoE with acute food impaction -- if not clearing will need acute EGD at bedside in the ER tonight.  Will need to followup with Dr. Fuller Plan for repeat dilation as an outpt.  Will need daily PPI and likely swallowed steroids for EoE. --Further recommendations after endoscopy The nature of the procedure, as well as the risks, benefits, and alternatives were carefully and thoroughly reviewed with the patient. Ample time for discussion and questions allowed. The patient understood, was satisfied, and agreed to proceed.      LOS: 0 days   Jerene Bears  12/14/2017, 8:10 PM

## 2017-12-15 ENCOUNTER — Encounter (HOSPITAL_COMMUNITY): Payer: Self-pay | Admitting: Internal Medicine

## 2017-12-15 ENCOUNTER — Telehealth: Payer: Self-pay

## 2017-12-15 MED ORDER — PANTOPRAZOLE SODIUM 40 MG PO TBEC: 40.0000 mg | DELAYED_RELEASE_TABLET | Freq: Every day | ORAL | 3 refills | Status: DC

## 2017-12-15 NOTE — Telephone Encounter (Signed)
Patient notified of recommendations and consents to proceed with EGD/dil.  She is scheduled for pre-visit and EGd.  Rx sent for 1 yr pantoprazole.

## 2017-12-15 NOTE — Telephone Encounter (Signed)
Ladene Artist, MD sent to Jerene Bears, MD; Marlon Pel, RN        Ireland Virrueta please schedule EGD/dilation. Refill pantoprazole 40mg  daily for 1 year and reinforce need to take it. Thx.     Previous Messages    ----- Message -----  From: Jerene Bears, MD  Sent: 12/14/2017 10:45 PM  To: Marlon Pel, RN, Ladene Artist, MD   Food impaction; [facility] ER  EGD with food removal  Needs pantoprazole (has not been on PPI) and follow-up with Dr. Fuller Plan and repeat dilation  JMP      Left message for patient to call back

## 2018-02-03 ENCOUNTER — Encounter: Payer: Self-pay | Admitting: Gastroenterology

## 2018-02-03 ENCOUNTER — Ambulatory Visit (AMBULATORY_SURGERY_CENTER): Payer: Self-pay | Admitting: *Deleted

## 2018-02-03 VITALS — Ht 64.5 in | Wt 186.0 lb

## 2018-02-03 DIAGNOSIS — K222 Esophageal obstruction: Secondary | ICD-10-CM

## 2018-02-03 NOTE — Progress Notes (Signed)
No egg or soy allergy known to patient  No issues with past sedation with any surgeries  or procedures, no intubation problems  No diet pills per patient No home 02 use per patient  No blood thinners per patient  Pt denies issues with constipation  No A fib or A flutter  EMMI video sent to pt's e mail pt declined   

## 2018-02-10 ENCOUNTER — Ambulatory Visit (AMBULATORY_SURGERY_CENTER): Payer: 59 | Admitting: Gastroenterology

## 2018-02-10 ENCOUNTER — Encounter: Payer: Self-pay | Admitting: Gastroenterology

## 2018-02-10 VITALS — BP 125/75 | HR 65 | Temp 98.0°F | Resp 16 | Ht 64.0 in | Wt 186.0 lb

## 2018-02-10 DIAGNOSIS — K222 Esophageal obstruction: Secondary | ICD-10-CM | POA: Diagnosis present

## 2018-02-10 DIAGNOSIS — K200 Eosinophilic esophagitis: Secondary | ICD-10-CM | POA: Diagnosis not present

## 2018-02-10 MED ORDER — SODIUM CHLORIDE 0.9 % IV SOLN
500.0000 mL | Freq: Once | INTRAVENOUS | Status: DC
Start: 2018-02-10 — End: 2020-11-27

## 2018-02-10 MED ORDER — FLUTICASONE PROPIONATE HFA 220 MCG/ACT IN AERO: INHALATION_SPRAY | RESPIRATORY_TRACT | 3 refills | Status: DC

## 2018-02-10 NOTE — Progress Notes (Signed)
Called to room to assist during endoscopic procedure.  Patient ID and intended procedure confirmed with present staff. Received instructions for my participation in the procedure from the performing physician.  

## 2018-02-10 NOTE — Patient Instructions (Signed)
Continue present medications. Follow Dilation Diet. Antireflux measures. Begin Flovent 284mcg inhaler 2 puffs twice a day, swallow. Please read handouts provided.     YOU HAD AN ENDOSCOPIC PROCEDURE TODAY AT [facility]:   Refer to the procedure report that was given to you for any specific questions about what was found during the examination.  If the procedure report does not answer your questions, please call your gastroenterologist to clarify.  If you requested that your care partner not be given the details of your procedure findings, then the procedure report has been included in a sealed envelope for you to review at your convenience later.  YOU SHOULD EXPECT: Some feelings of bloating in the abdomen. Passage of more gas than usual.  Walking can help get rid of the air that was put into your GI tract during the procedure and reduce the bloating. If you had a lower endoscopy (such as a colonoscopy or flexible sigmoidoscopy) you may notice spotting of blood in your stool or on the toilet paper. If you underwent a bowel prep for your procedure, you may not have a normal bowel movement for a few days.  Please Note:  You might notice some irritation and congestion in your nose or some drainage.  This is from the oxygen used during your procedure.  There is no need for concern and it should clear up in a day or so.  SYMPTOMS TO REPORT IMMEDIATELY:     Following upper endoscopy (EGD)  Vomiting of blood or coffee ground material  New chest pain or pain under the shoulder blades  Painful or persistently difficult swallowing  New shortness of breath  Fever of 100F or higher  Black, tarry-looking stools  For urgent or emergent issues, a gastroenterologist can be reached at any hour by calling [phone].   DIET:  We do recommend a small meal at first, but then you may proceed to your regular diet.  Drink plenty of fluids but you should avoid alcoholic beverages for 24  hours.  ACTIVITY:  You should plan to take it easy for the rest of today and you should NOT DRIVE or use heavy machinery until tomorrow (because of the sedation medicines used during the test).    FOLLOW UP: Our staff will call the number listed on your records the next business day following your procedure to check on you and address any questions or concerns that you may have regarding the information given to you following your procedure. If we do not reach you, we will leave a message.  However, if you are feeling well and you are not experiencing any problems, there is no need to return our call.  We will assume that you have returned to your regular daily activities without incident.  If any biopsies were taken you will be contacted by phone or by letter within the next 1-3 weeks.  Please call us at [phone] if you have not heard about the biopsies in 3 weeks.    SIGNATURES/CONFIDENTIALITY: You and/or your care partner have signed paperwork which will be entered into your electronic medical record.  These signatures attest to the fact that that the information above on your After Visit Summary has been reviewed and is understood.  Full responsibility of the confidentiality of this discharge information lies with you and/or your care-partner.

## 2018-02-10 NOTE — Progress Notes (Signed)
To PACU, VSS. Report to [email or web address] 

## 2018-02-10 NOTE — Progress Notes (Signed)
Pt's states no medical or surgical changes since previsit or office visit. 

## 2018-02-10 NOTE — Op Note (Signed)
[facility] Patient Name: [name] Procedure Date: 02/10/2018 10:22 AM MRN: [id number] Endoscopist: Ladene Artist , MD Age: 52 Referring MD:  Date of Birth: 27-Jan-1966 Gender: Female Account #: [id number] Procedure:                Upper GI endoscopy Indications:              Dysphagia Medicines:                Monitored Anesthesia Care Procedure:                Pre-Anesthesia Assessment:                           - Prior to the procedure, a History and Physical                            was performed, and patient medications and                            allergies were reviewed. The patient's tolerance of                            previous anesthesia was also reviewed. The risks                            and benefits of the procedure and the sedation                            options and risks were discussed with the patient.                            All questions were answered, and informed consent                            was obtained. Prior Anticoagulants: The patient has                            taken no previous anticoagulant or antiplatelet                            agents. ASA Grade Assessment: II - A patient with                            mild systemic disease. After reviewing the risks                            and benefits, the patient was deemed in                            satisfactory condition to undergo the procedure.                           After obtaining informed consent, the endoscope was  passed under direct vision. Throughout the                            procedure, the patient's blood pressure, pulse, and                            oxygen saturations were monitored continuously. The                            Endoscope was introduced through the mouth, and                            advanced to the second part of duodenum. The upper                            GI endoscopy was accomplished without  difficulty.                            The patient tolerated the procedure well. Scope In: Scope Out: Findings:                 One benign-appearing, intrinsic moderate stenosis                            was found at the gastroesophageal junction. This                            stenosis measured 1.1 cm (inner diameter). The                            stenosis was traversed. A guidewire was placed and                            the scope was withdrawn. Dilations were performed                            with Savary dilators with mild resistance at 12 mm,                            13 mm and 14 mm.                           Mucosal changes including longitudinal furrows and                            white plaques were found in the distal esophagus.                           The exam of the esophagus was otherwise normal.                           Multiple 3 to 4 mm sessile polyps with no bleeding  and no stigmata of recent bleeding were found in                            the gastric fundus and in the gastric body.                           The exam of the stomach was otherwise normal.                           The duodenal bulb and second portion of the                            duodenum were normal. Complications:            No immediate complications. Estimated Blood Loss:     Estimated blood loss: none. Impression:               - Benign-appearing esophageal stenosis. Dilated.                           - Esophageal mucosal changes consistent with                            eosinophilic esophagitis.                           - Multiple gastric polyps.                           - Normal duodenal bulb and second portion of the                            duodenum.                           - No specimens collected. Recommendation:           - Patient has a contact number available for                            emergencies. The signs and symptoms of potential                             delayed complications were discussed with the                            patient. Return to normal activities tomorrow.                            Written discharge instructions were provided to the                            patient.                           - Clear liquid diet today, then advance as  tolerated to soft diet today.                           - Antireflux measures.                           - Continue present medications, including                            omeprazole 20 mg po qam.                           - Flovent 220 mcg inhaler 2 puffs bid, swallowed, 3                            months of refills                           - Return to GI office in 6 weeks. Ladene Artist, MD 02/10/2018 10:45:50 AM This report has been signed electronically.

## 2018-02-11 ENCOUNTER — Telehealth: Payer: Self-pay

## 2018-02-11 NOTE — Telephone Encounter (Signed)
  Follow up Call-  Call back number 02/10/2018 10/06/2016  Post procedure Call Back phone  # [phone]  Permission to leave phone message Yes Yes  Some recent data might be hidden     Patient questions:  Do you have a fever, pain , or abdominal swelling? No. Pain Score  0 *  Have you tolerated food without any problems? Yes.    Have you been able to return to your normal activities? Yes.    Do you have any questions about your discharge instructions: Diet   No. Medications  No. Follow up visit  No.  Do you have questions or concerns about your Care? No.  Actions: * If pain score is 4 or above: No action needed, pain <4.

## 2018-03-22 ENCOUNTER — Other Ambulatory Visit: Payer: Self-pay

## 2018-03-22 MED ORDER — FLUTICASONE PROPIONATE HFA 220 MCG/ACT IN AERO: INHALATION_SPRAY | RESPIRATORY_TRACT | 0 refills | Status: DC

## 2018-03-30 ENCOUNTER — Ambulatory Visit: Payer: 59 | Admitting: Gastroenterology

## 2018-04-28 ENCOUNTER — Ambulatory Visit (INDEPENDENT_AMBULATORY_CARE_PROVIDER_SITE_OTHER): Payer: 59 | Admitting: Gastroenterology

## 2018-04-28 ENCOUNTER — Encounter: Payer: Self-pay | Admitting: Gastroenterology

## 2018-04-28 VITALS — BP 140/100 | HR 88 | Ht 64.0 in | Wt 182.0 lb

## 2018-04-28 DIAGNOSIS — K649 Unspecified hemorrhoids: Secondary | ICD-10-CM | POA: Diagnosis not present

## 2018-04-28 DIAGNOSIS — K200 Eosinophilic esophagitis: Secondary | ICD-10-CM | POA: Diagnosis not present

## 2018-04-28 DIAGNOSIS — K5904 Chronic idiopathic constipation: Secondary | ICD-10-CM

## 2018-04-28 MED ORDER — HYDROCORTISONE 2.5 % RE CREA: 1.0000 "application " | TOPICAL_CREAM | Freq: Two times a day (BID) | RECTAL | 1 refills | Status: DC

## 2018-04-28 NOTE — Patient Instructions (Signed)
We have sent the following medications to your pharmacy for you to pick up at your convenience: Anusol HC cream.  Take over the counter Miralax mixing 17 grams in 8 oz water/juice 1-2 x daily.   RECTAL CARE INSTRUCTIONS:  1. Sitz Baths twice a day for 10 minutes each. 2. Thoroughly clean and dry the rectum. 3. Put Tucks pad against the rectum at night. 4. Clean the rectum with Balenol lotion after each bowel movement.  You have been given a High fiber diet to follow.   Thank you for choosing me and [facility].  Pricilla Riffle. Dagoberto Ligas., MD., [facility] Regal

## 2018-04-28 NOTE — Progress Notes (Signed)
    History of Present Illness: This is a 52 year old female returning for follow-up of eosinophilic esophagitis and dysphagia.  History dysphasia improved substantially following EGD with dilation and with continued use of Flovent inhaler.  She discontinued omeprazole use as she did not feel it was beneficial.  She has had ongoing problems with constipation for many years and her symptoms have worsened over the past 2 to 3 months.  She describes a bowel movement about every 7 to 8 days associated with substantial straining generalized abdominal discomfort and a flare of hemorrhoid symptoms.  She is not taking any laxative on a regular basis.  She has taken MiraLAX in the past.  EGD 01/2018: - Benign-appearing esophageal stenosis. Dilated. - Esophageal mucosal changes consistent with eosinophilic esophagitis. - Multiple gastric polyps. - Normal duodenal bulb and second portion of the duodenum. - No specimens collected.  Colonoscopy 08/2013: 1. Normal colon 2. Internal and external hemorrhoids  Current Medications, Allergies, Past Medical History, Past Surgical History, Family History and Social History were reviewed in [employer] record.  Physical Exam: General: Well developed, well nourished, no acute distress Head: Normocephalic and atraumatic Eyes:  sclerae anicteric, EOMI Ears: Normal auditory acuity Mouth: No deformity or lesions Lungs: Clear throughout to auscultation Heart: Regular rate and rhythm; no murmurs, rubs or bruits Abdomen: Soft, non tender and non distended. No masses, hepatosplenomegaly or hernias noted. Normal Bowel sounds Rectal: Not done Musculoskeletal: Symmetrical with no gross deformities  Pulses:  Normal pulses noted Extremities: No clubbing, cyanosis, edema or deformities noted Neurological: Alert oriented x 4, grossly nonfocal Psychological:  Alert and cooperative. Normal mood and affect   Assessment and Recommendations:  1.  Eosinophilic esophagitis. Flovent 2 puffs swallowed bid. REV in 6 months.   2.  Chronic idiopathic constipation.  MiraLAX once or twice daily titrated for complete moderate bowel movement every day to every other day.  Follow a high fiber diet long term.  Adequate daily water intake long term.  If her constipation is not adequately managed with above over the next few weeks she is advised to call for further advice and we will consider other medications such as Linzess or Trulance.   3. Internal and external hemorrhoids.  Symptoms flare with constipation and straining.  We discussed all measures for managing constipation to also benefit hemorrhoid management.  Anusol HC cream PR bid prn. Rectal care instructions.

## 2018-09-15 ENCOUNTER — Other Ambulatory Visit: Payer: Self-pay | Admitting: Gastroenterology

## 2020-01-05 ENCOUNTER — Other Ambulatory Visit: Payer: Self-pay | Admitting: Gastroenterology

## 2020-10-30 ENCOUNTER — Ambulatory Visit (INDEPENDENT_AMBULATORY_CARE_PROVIDER_SITE_OTHER): Payer: BC Managed Care – PPO | Admitting: Gastroenterology

## 2020-10-30 ENCOUNTER — Encounter: Payer: Self-pay | Admitting: Gastroenterology

## 2020-10-30 VITALS — BP 122/78 | HR 77 | Ht 64.0 in | Wt 189.0 lb

## 2020-10-30 DIAGNOSIS — R1319 Other dysphagia: Secondary | ICD-10-CM

## 2020-10-30 DIAGNOSIS — K200 Eosinophilic esophagitis: Secondary | ICD-10-CM

## 2020-10-30 NOTE — Patient Instructions (Signed)
You have been scheduled for an endoscopy. Please follow written instructions given to you at your visit today. If you use inhalers (even only as needed), please bring them with you on the day of your procedure.  Normal BMI (Body Mass Index- based on height and weight) is between 19 and 25. Your BMI today is Body mass index is 32.44 kg/m. Marland Kitchen Please consider follow up  regarding your BMI with your Primary Care Provider.  Thank you for choosing me and [facility].  Pricilla Riffle. Dagoberto Ligas., MD., [facility] Regal

## 2020-10-30 NOTE — Progress Notes (Signed)
    History of Present Illness: This is a 55 year old female with recurrent dysphagia. She has history of eosinophilic esophagitis. Her last office visit was in Dec 2019 and she has not returned for follow up.  She relates intermittent difficulties with swallowing solid foods.  Over the past several months her symptoms have substantially worsened.  Last week while vacationing at the beach she had 3 episodes of solid food dysphagia and one was prolonged to the point where she was planning to seek help in an ED.  The food bolus eventually passed prior to needing ED care.  She is currently taking Flovent 3 to 4 puffs 3 times a day.  Current Medications, Allergies, Past Medical History, Past Surgical History, Family History and Social History were reviewed in [employer] record.   Physical Exam: General: Well developed, well nourished, no acute distress Head: Normocephalic and atraumatic Eyes: Sclerae anicteric, EOMI Ears: Normal auditory acuity Mouth: Not examined, mask on during Covid-19 pandemic Lungs: Clear throughout to auscultation Heart: Regular rate and rhythm; no murmurs, rubs or bruits Abdomen: Soft, non tender and non distended. No masses, hepatosplenomegaly or hernias noted. Normal Bowel sounds Rectal:Not done Musculoskeletal: Symmetrical with no gross deformities  Pulses:  Normal pulses noted Extremities: No clubbing, cyanosis, edema or deformities noted Neurological: Alert oriented x 4, grossly nonfocal Psychological:  Alert and cooperative. Normal mood and affect   Assessment and Recommendations:  Dysphagia, eosinophilic esophagitis.  Suspected recurrent esophageal stricture(s).  Soft foods until dilation is performed.  Reduce Flovent 220 mcg dosing to 2 puffs twice daily.  Schedule EGD with dilation. The risks (including bleeding, perforation, infection, missed lesions, medication reactions and possible hospitalization or surgery if complications occur),  benefits, and alternatives to endoscopy with possible biopsy and possible dilation were discussed with the patient and they consent to proceed.    2.  CRC screening, average risk. Colonoscopy in April 2025.

## 2020-11-27 ENCOUNTER — Encounter: Payer: Self-pay | Admitting: Gastroenterology

## 2020-11-27 ENCOUNTER — Other Ambulatory Visit: Payer: Self-pay

## 2020-11-27 ENCOUNTER — Ambulatory Visit (AMBULATORY_SURGERY_CENTER): Payer: BC Managed Care – PPO | Admitting: Gastroenterology

## 2020-11-27 VITALS — BP 121/76 | HR 68 | Temp 98.4°F | Resp 16 | Ht 64.0 in | Wt 189.0 lb

## 2020-11-27 DIAGNOSIS — R1319 Other dysphagia: Secondary | ICD-10-CM | POA: Diagnosis present

## 2020-11-27 DIAGNOSIS — K200 Eosinophilic esophagitis: Secondary | ICD-10-CM

## 2020-11-27 DIAGNOSIS — K317 Polyp of stomach and duodenum: Secondary | ICD-10-CM | POA: Diagnosis not present

## 2020-11-27 DIAGNOSIS — K222 Esophageal obstruction: Secondary | ICD-10-CM | POA: Diagnosis not present

## 2020-11-27 DIAGNOSIS — K219 Gastro-esophageal reflux disease without esophagitis: Secondary | ICD-10-CM | POA: Diagnosis not present

## 2020-11-27 MED ORDER — SODIUM CHLORIDE 0.9 % IV SOLN: 500.0000 mL | Freq: Once | INTRAVENOUS | Status: DC

## 2020-11-27 MED ORDER — PANTOPRAZOLE SODIUM 40 MG PO TBEC: 40.0000 mg | DELAYED_RELEASE_TABLET | Freq: Every day | ORAL | 11 refills | Status: DC

## 2020-11-27 NOTE — Progress Notes (Signed)
pt tolerated well. VSS. awake and to recovery. Report given to RN.  Bite block left insitu to reovery. No truama.

## 2020-11-27 NOTE — Progress Notes (Signed)
VS taken by [place] 

## 2020-11-27 NOTE — Patient Instructions (Addendum)
CLEAR LIQUID DIET FOR 2 HRS ,THEN SOFT FOODS THE REST OF TODAY, REGULAR DIET TOMORROW AS  TOLERATED   PANTOPRAZOLE 40 MG DAILY - ORDERED AND SENT TO YOUR PHARMACY  AWAIT PATHOLOGY RESULTS  RETURN FOR A OFFICE VISIT IN 6 WEEKS - MAKE THIS APPOINTMENT    YOU HAD AN ENDOSCOPIC PROCEDURE TODAY AT THE [facility]:   Refer to the procedure report that was given to you for any specific questions about what was found during the examination.  If the procedure report does not answer your questions, please call your gastroenterologist to clarify.  If you requested that your care partner not be given the details of your procedure findings, then the procedure report has been included in a sealed envelope for you to review at your convenience later.  YOU SHOULD EXPECT: Some feelings of bloating in the abdomen. Passage of more gas than usual.  Walking can help get rid of the air that was put into your GI tract during the procedure and reduce the bloating. If you had a lower endoscopy (such as a colonoscopy or flexible sigmoidoscopy) you may notice spotting of blood in your stool or on the toilet paper. If you underwent a bowel prep for your procedure, you may not have a normal bowel movement for a few days.  Please Note:  You might notice some irritation and congestion in your nose or some drainage.  This is from the oxygen used during your procedure.  There is no need for concern and it should clear up in a day or so.  SYMPTOMS TO REPORT IMMEDIATELY:   Following upper endoscopy (EGD)  Vomiting of blood or coffee ground material  New chest pain or pain under the shoulder blades  Painful or persistently difficult swallowing  New shortness of breath  Fever of 100F or higher  Black, tarry-looking stools  For urgent or emergent issues, a gastroenterologist can be reached at any hour by calling [phone]. Do not use MyChart messaging for urgent concerns.    DIET:   FOLLOW DILATATION  DIET TODAY   & Drink plenty of fluids but you should avoid alcoholic beverages for 24 hours.  ACTIVITY:  You should plan to take it easy for the rest of today and you should NOT DRIVE or use heavy machinery until tomorrow (because of the sedation medicines used during the test).    FOLLOW UP: Our staff will call the number listed on your records 48-72 hours following your procedure to check on you and address any questions or concerns that you may have regarding the information given to you following your procedure. If we do not reach you, we will leave a message.  We will attempt to reach you two times.  During this call, we will ask if you have developed any symptoms of COVID 19. If you develop any symptoms (ie: fever, flu-like symptoms, shortness of breath, cough etc.) before then, please call [phone].  If you test positive for Covid 19 in the 2 weeks post procedure, please call and report this information to [place].    If any biopsies were taken you will be contacted by phone or by letter within the next 1-3 weeks.  Please call us at [phone] if you have not heard about the biopsies in 3 weeks.    SIGNATURES/CONFIDENTIALITY: You and/or your care partner have signed paperwork which will be entered into your electronic medical record.  These signatures attest to the fact that that the  information above on your After Visit Summary has been reviewed and is understood.  Full responsibility of the confidentiality of this discharge information lies with you and/or your care-partner.

## 2020-11-27 NOTE — Progress Notes (Signed)
Called to room to assist during endoscopic procedure.  Patient ID and intended procedure confirmed with present staff. Received instructions for my participation in the procedure from the performing physician.  

## 2020-11-27 NOTE — Op Note (Signed)
[facility] Patient Name: [name] Procedure Date: 11/27/2020 9:32 AM MRN: [id number] Endoscopist: Ladene Artist , MD Age: 55 Referring MD:  Date of Birth: 09/07/1965 Gender: Female Account #: [id number] Procedure:                Upper GI endoscopy Indications:              Dysphagia Medicines:                Monitored Anesthesia Care Procedure:                Pre-Anesthesia Assessment:                           - Prior to the procedure, a History and Physical                            was performed, and patient medications and                            allergies were reviewed. The patient's tolerance of                            previous anesthesia was also reviewed. The risks                            and benefits of the procedure and the sedation                            options and risks were discussed with the patient.                            All questions were answered, and informed consent                            was obtained. Prior Anticoagulants: The patient has                            taken no previous anticoagulant or antiplatelet                            agents. ASA Grade Assessment: II - A patient with                            mild systemic disease. After reviewing the risks                            and benefits, the patient was deemed in                            satisfactory condition to undergo the procedure.                           After obtaining informed consent, the endoscope was  passed under direct vision. Throughout the                            procedure, the patient's blood pressure, pulse, and                            oxygen saturations were monitored continuously. The                            GIF [id number] #[id number] was introduced through the                            mouth, and advanced to the second part of duodenum.                            The upper GI endoscopy was accomplished without                             difficulty. The patient tolerated the procedure                            well. Scope In: Scope Out: Findings:                 Mucosal changes including longitudinal furrows and                            white plaques were found in the mid esophagus and                            in the distal esophagus. Biopsies were taken with a                            cold forceps for histology.                           One benign-appearing, intrinsic moderate stenosis                            was found at the gastroesophageal junction. This                            stenosis measured 1.2 cm (inner diameter) x less                            than one cm (in length). The stenosis was                            traversed. A guidewire was placed and the scope was                            withdrawn. Dilations were performed with Savary  dilators with mild resistance at 15 mm, 16 mm and                            17 mm.                           The exam of the esophagus was otherwise normal.                           Multiple 3 to 5 mm sessile polyps with no bleeding                            and no stigmata of recent bleeding were found in                            the gastric fundus and in the gastric body.                            Biopsies were taken with a cold forceps for                            histology.                           The exam of the stomach was otherwise normal.                           The duodenal bulb and second portion of the                            duodenum were normal. Complications:            No immediate complications. Estimated Blood Loss:     Estimated blood loss was minimal. Impression:               - Esophageal mucosal changes consistent with                            eosinophilic esophagitis. Biopsied.                           - Benign-appearing esophageal stenosis. Dilated.                            - Multiple gastric polyps. Biopsied.                           - Normal duodenal bulb and second portion of the                            duodenum. Recommendation:           - Patient has a contact number available for                            emergencies. The signs and symptoms of potential  delayed complications were discussed with the                            patient. Return to normal activities tomorrow.                            Written discharge instructions were provided to the                            patient.                           - Clear liquid diet for 2 hours, then advance as                            tolerated to soft diet today.                           - Resume prior diet tomorrow.                           - Continue present medications.                           - Pantoprazole 40 mg po qd, 1 year of refills.                           - Await pathology results.                           - Return to GI office in 6 weeks. Ladene Artist, MD 11/27/2020 10:02:25 AM This report has been signed electronically.

## 2020-12-06 ENCOUNTER — Encounter: Payer: Self-pay | Admitting: Gastroenterology

## 2020-12-13 ENCOUNTER — Telehealth: Payer: Self-pay | Admitting: Gastroenterology

## 2020-12-13 MED ORDER — FLUTICASONE PROPIONATE HFA 220 MCG/ACT IN AERO: INHALATION_SPRAY | RESPIRATORY_TRACT | 3 refills | Status: DC

## 2020-12-13 NOTE — Telephone Encounter (Signed)
Inbound call from patient requesting a call please.  She was under the impression that fluticasone medication was being renewed at her last appt.  Please advise.

## 2020-12-13 NOTE — Telephone Encounter (Signed)
Patient states she wants to make sure she is to continue Flovent. Informed patient that per Dr. Silvio Pate last note, she is to continue. Prescription sent to mail order pharmacy per patient.

## 2020-12-31 ENCOUNTER — Other Ambulatory Visit: Payer: Self-pay | Admitting: Obstetrics and Gynecology

## 2020-12-31 DIAGNOSIS — R928 Other abnormal and inconclusive findings on diagnostic imaging of breast: Secondary | ICD-10-CM

## 2021-01-09 ENCOUNTER — Ambulatory Visit
Admission: RE | Admit: 2021-01-09 | Discharge: 2021-01-09 | Disposition: A | Discharge status: Home / Self Care | Payer: BC Managed Care – PPO | Source: Ambulatory Visit | Attending: Obstetrics and Gynecology | Admitting: Obstetrics and Gynecology

## 2021-01-09 ENCOUNTER — Other Ambulatory Visit: Payer: Self-pay

## 2021-01-09 DIAGNOSIS — R928 Other abnormal and inconclusive findings on diagnostic imaging of breast: Secondary | ICD-10-CM

## 2021-01-24 ENCOUNTER — Encounter: Payer: Self-pay | Admitting: Gastroenterology

## 2021-01-24 ENCOUNTER — Ambulatory Visit (INDEPENDENT_AMBULATORY_CARE_PROVIDER_SITE_OTHER): Payer: BC Managed Care – PPO | Admitting: Gastroenterology

## 2021-01-24 VITALS — BP 130/84 | HR 68 | Ht 63.75 in | Wt 189.2 lb

## 2021-01-24 DIAGNOSIS — R1319 Other dysphagia: Secondary | ICD-10-CM | POA: Diagnosis not present

## 2021-01-24 DIAGNOSIS — K222 Esophageal obstruction: Secondary | ICD-10-CM

## 2021-01-24 MED ORDER — PANTOPRAZOLE SODIUM 40 MG PO TBEC
40.0000 mg | DELAYED_RELEASE_TABLET | Freq: Every day | ORAL | 3 refills | Status: DC
Start: 2021-01-24 — End: 2021-12-27

## 2021-01-24 NOTE — Progress Notes (Signed)
    History of Present Illness: This is a 55 year old female returning for follow-up of dysphagia.  She underwent EGD with esophageal stricture dilation as below.  Her dysphagia has completely resolved.  Biopsies revealed changes consistent with reflux and did not reveal changes of eosinophilic esophagitis.     EGD 11/2020 - Esophageal mucosal changes consistent with eosinophilic esophagitis. Biopsied. - Benign-appearing esophageal stenosis. Dilated. - Multiple gastric polyps. Biopsied. - Normal duodenal bulb and second portion of the duodenum.  Path: reflux changes, benign fundic gland polyps.   Current Medications, Allergies, Past Medical History, Past Surgical History, Family History and Social History were reviewed in [employer] record.   Physical Exam: General: Well developed, well nourished, no acute distress Head: Normocephalic and atraumatic Eyes: Sclerae anicteric, EOMI Ears: Normal auditory acuity Mouth: Not examined, mask on during Covid-19 pandemic Lungs: Clear throughout to auscultation Heart: Regular rate and rhythm; no murmurs, rubs or bruits Abdomen: Soft, non tender and non distended. No masses, hepatosplenomegaly or hernias noted. Normal Bowel sounds Rectal: Not done Musculoskeletal: Symmetrical with no gross deformities  Pulses:  Normal pulses noted Extremities: No clubbing, cyanosis, edema or deformities noted Neurological: Alert oriented x 4, grossly nonfocal Psychological:  Alert and cooperative. Normal mood and affect   Assessment and Recommendations:   GERD, esophageal stricture.  History of eosinophilic esophagitis.  Query whether this is EoE under excellent control on Flovent or it is no longer active and she currently has a reflux induced process.  We discussed the options of remaining on Flovent alone or Flovent plus pantoprazole or pantoprazole alone.  Given that EoE will often respond to PPI therapy alone in shared decision  making she is agreeable to discontinue Flovent and remain on pantoprazole 40 mg daily.  She is advised to call if she has recurrent symptoms otherwise REV in 1 year.

## 2021-01-24 NOTE — Patient Instructions (Signed)
Stop using your inhaler.   Continue your pantoprazole daily. A prescription has been sent to your mail order pharmacy.   Thank you for choosing me and [facility].  Pricilla Riffle. Dagoberto Ligas., MD., Marval Regal

## 2021-12-27 ENCOUNTER — Other Ambulatory Visit: Payer: Self-pay | Admitting: Gastroenterology

## 2021-12-27 DIAGNOSIS — R1319 Other dysphagia: Secondary | ICD-10-CM

## 2021-12-27 DIAGNOSIS — K222 Esophageal obstruction: Secondary | ICD-10-CM

## 2022-02-26 ENCOUNTER — Other Ambulatory Visit: Payer: Self-pay | Admitting: Obstetrics and Gynecology

## 2022-02-26 DIAGNOSIS — R928 Other abnormal and inconclusive findings on diagnostic imaging of breast: Secondary | ICD-10-CM

## 2022-03-11 ENCOUNTER — Ambulatory Visit
Admission: RE | Admit: 2022-03-11 | Discharge: 2022-03-11 | Disposition: A | Discharge status: Home / Self Care | Payer: BC Managed Care – PPO | Source: Ambulatory Visit | Attending: Obstetrics and Gynecology | Admitting: Obstetrics and Gynecology

## 2022-03-11 ENCOUNTER — Other Ambulatory Visit: Payer: Self-pay | Admitting: Obstetrics and Gynecology

## 2022-03-11 DIAGNOSIS — R928 Other abnormal and inconclusive findings on diagnostic imaging of breast: Secondary | ICD-10-CM

## 2022-03-11 DIAGNOSIS — N6489 Other specified disorders of breast: Secondary | ICD-10-CM

## 2022-03-25 ENCOUNTER — Ambulatory Visit
Admission: RE | Admit: 2022-03-25 | Discharge: 2022-03-25 | Disposition: A | Discharge status: Home / Self Care | Payer: BC Managed Care – PPO | Source: Ambulatory Visit | Attending: Obstetrics and Gynecology | Admitting: Obstetrics and Gynecology

## 2022-03-25 DIAGNOSIS — N6489 Other specified disorders of breast: Secondary | ICD-10-CM

## 2022-03-25 HISTORY — PX: BREAST BIOPSY: SHX20

## 2022-03-27 ENCOUNTER — Other Ambulatory Visit: Payer: Self-pay | Admitting: Gastroenterology

## 2022-03-27 DIAGNOSIS — K222 Esophageal obstruction: Secondary | ICD-10-CM

## 2022-03-27 DIAGNOSIS — R1319 Other dysphagia: Secondary | ICD-10-CM

## 2022-04-14 ENCOUNTER — Ambulatory Visit: Payer: Self-pay | Admitting: Surgery

## 2022-04-14 DIAGNOSIS — N6091 Unspecified benign mammary dysplasia of right breast: Secondary | ICD-10-CM

## 2022-04-22 ENCOUNTER — Other Ambulatory Visit: Payer: Self-pay | Admitting: Surgery

## 2022-04-22 DIAGNOSIS — N6091 Unspecified benign mammary dysplasia of right breast: Secondary | ICD-10-CM

## 2022-05-21 ENCOUNTER — Encounter (HOSPITAL_BASED_OUTPATIENT_CLINIC_OR_DEPARTMENT_OTHER): Payer: Self-pay | Admitting: Surgery

## 2022-05-21 ENCOUNTER — Other Ambulatory Visit: Payer: Self-pay

## 2022-05-26 ENCOUNTER — Ambulatory Visit
Admission: RE | Admit: 2022-05-26 | Discharge: 2022-05-26 | Disposition: A | Discharge status: Home / Self Care | Payer: BC Managed Care – PPO | Source: Ambulatory Visit | Attending: Surgery | Admitting: Surgery

## 2022-05-26 DIAGNOSIS — N6091 Unspecified benign mammary dysplasia of right breast: Secondary | ICD-10-CM

## 2022-05-26 HISTORY — PX: BREAST BIOPSY: SHX20

## 2022-05-26 NOTE — Progress Notes (Addendum)
Sent text reminding pt to come pick up drink and soap.   Surgical soap given with instructions, pt verbalized understanding.Enhanced Recovery after Surgery  Enhanced Recovery after Surgery is a protocol used to improve the stress on your body and your recovery after surgery.  Patient Instructions  The night before surgery:  No food after midnight. ONLY clear liquids after midnight  The day of surgery (if you do NOT have diabetes):  Drink ONE (1) Pre-Surgery Clear Ensure as directed.   This drink was given to you during your hospital  pre-op appointment visit. The pre-op nurse will instruct you on the time to drink the  Pre-Surgery Ensure depending on your surgery time. Finish the drink at the designated time by the pre-op nurse.  Nothing else to drink after completing the  Pre-Surgery Clear Ensure.  The day of surgery (if you have diabetes): Drink ONE (1) Gatorade 2 (G2) as directed. This drink was given to you during your hospital  pre-op appointment visit.  The pre-op nurse will instruct you on the time to drink the   Gatorade 2 (G2) depending on your surgery time. Color of the Gatorade may vary. Red is not allowed. Nothing else to drink after completing the  Gatorade 2 (G2).         If office.you have questions, please contact your surgeon's office

## 2022-05-27 ENCOUNTER — Other Ambulatory Visit: Payer: Self-pay

## 2022-05-27 ENCOUNTER — Encounter (HOSPITAL_BASED_OUTPATIENT_CLINIC_OR_DEPARTMENT_OTHER)
Admission: RE | Disposition: A | Discharge status: Home / Self Care | Payer: Self-pay | Source: Home / Self Care | Attending: Surgery

## 2022-05-27 ENCOUNTER — Ambulatory Visit (HOSPITAL_BASED_OUTPATIENT_CLINIC_OR_DEPARTMENT_OTHER): Payer: BC Managed Care – PPO | Admitting: Certified Registered"

## 2022-05-27 ENCOUNTER — Ambulatory Visit
Admission: RE | Admit: 2022-05-27 | Discharge: 2022-05-27 | Disposition: A | Discharge status: Home / Self Care | Payer: BC Managed Care – PPO | Source: Ambulatory Visit | Attending: Surgery | Admitting: Surgery

## 2022-05-27 ENCOUNTER — Ambulatory Visit (HOSPITAL_BASED_OUTPATIENT_CLINIC_OR_DEPARTMENT_OTHER)
Admission: RE | Admit: 2022-05-27 | Discharge: 2022-05-27 | Disposition: A | Discharge status: Home / Self Care | Payer: BC Managed Care – PPO | Attending: Surgery | Admitting: Surgery

## 2022-05-27 ENCOUNTER — Encounter (HOSPITAL_BASED_OUTPATIENT_CLINIC_OR_DEPARTMENT_OTHER): Payer: Self-pay | Admitting: Surgery

## 2022-05-27 DIAGNOSIS — F419 Anxiety disorder, unspecified: Secondary | ICD-10-CM | POA: Diagnosis not present

## 2022-05-27 DIAGNOSIS — N6091 Unspecified benign mammary dysplasia of right breast: Secondary | ICD-10-CM | POA: Insufficient documentation

## 2022-05-27 DIAGNOSIS — Z79899 Other long term (current) drug therapy: Secondary | ICD-10-CM | POA: Diagnosis not present

## 2022-05-27 DIAGNOSIS — K219 Gastro-esophageal reflux disease without esophagitis: Secondary | ICD-10-CM | POA: Insufficient documentation

## 2022-05-27 DIAGNOSIS — N6041 Mammary duct ectasia of right breast: Secondary | ICD-10-CM | POA: Diagnosis present

## 2022-05-27 DIAGNOSIS — F32A Depression, unspecified: Secondary | ICD-10-CM | POA: Insufficient documentation

## 2022-05-27 DIAGNOSIS — Z01818 Encounter for other preprocedural examination: Secondary | ICD-10-CM

## 2022-05-27 HISTORY — DX: Unspecified benign mammary dysplasia of right breast: N60.91

## 2022-05-27 HISTORY — PX: BREAST LUMPECTOMY WITH RADIOACTIVE SEED LOCALIZATION: SHX6424

## 2022-05-27 SURGERY — BREAST LUMPECTOMY WITH RADIOACTIVE SEED LOCALIZATION
Anesthesia: General | Site: Breast | Laterality: Right

## 2022-05-27 MED ORDER — EPHEDRINE 5 MG/ML INJ
INTRAVENOUS | Status: AC
  Filled 2022-05-27: qty 5

## 2022-05-27 MED ORDER — ACETAMINOPHEN 10 MG/ML IV SOLN: 1000.0000 mg | Freq: Once | INTRAVENOUS | Status: DC | PRN

## 2022-05-27 MED ORDER — CEFAZOLIN SODIUM-DEXTROSE 2-4 GM/100ML-% IV SOLN
INTRAVENOUS | Status: AC
  Filled 2022-05-27: qty 100

## 2022-05-27 MED ORDER — ACETAMINOPHEN 500 MG PO TABS
ORAL_TABLET | ORAL | Status: AC
  Filled 2022-05-27: qty 2

## 2022-05-27 MED ORDER — CEFAZOLIN SODIUM-DEXTROSE 2-4 GM/100ML-% IV SOLN
2.0000 g | INTRAVENOUS | Status: AC
  Administered 2022-05-27: 2 g via INTRAVENOUS

## 2022-05-27 MED ORDER — LIDOCAINE 2% (20 MG/ML) 5 ML SYRINGE
INTRAMUSCULAR | Status: AC
  Filled 2022-05-27: qty 5

## 2022-05-27 MED ORDER — CHLORHEXIDINE GLUCONATE CLOTH 2 % EX PADS: 6.0000 | MEDICATED_PAD | Freq: Once | CUTANEOUS | Status: DC

## 2022-05-27 MED ORDER — MIDAZOLAM HCL 5 MG/5ML IJ SOLN
INTRAMUSCULAR | Status: DC | PRN
  Administered 2022-05-27: 2 mg via INTRAVENOUS

## 2022-05-27 MED ORDER — OXYCODONE HCL 5 MG PO TABS
ORAL_TABLET | ORAL | Status: AC
  Filled 2022-05-27: qty 1

## 2022-05-27 MED ORDER — LACTATED RINGERS IV SOLN: INTRAVENOUS | Status: DC

## 2022-05-27 MED ORDER — GABAPENTIN 300 MG PO CAPS
300.0000 mg | ORAL_CAPSULE | ORAL | Status: AC
  Administered 2022-05-27: 300 mg via ORAL

## 2022-05-27 MED ORDER — OXYCODONE HCL 5 MG PO TABS: 5.0000 mg | ORAL_TABLET | Freq: Four times a day (QID) | ORAL | 0 refills | Status: DC | PRN

## 2022-05-27 MED ORDER — DEXAMETHASONE SODIUM PHOSPHATE 10 MG/ML IJ SOLN
INTRAMUSCULAR | Status: AC
  Filled 2022-05-27: qty 1

## 2022-05-27 MED ORDER — ONDANSETRON HCL 4 MG/2ML IJ SOLN
INTRAMUSCULAR | Status: DC | PRN
  Administered 2022-05-27: 4 mg via INTRAVENOUS

## 2022-05-27 MED ORDER — GABAPENTIN 300 MG PO CAPS
ORAL_CAPSULE | ORAL | Status: AC
  Filled 2022-05-27: qty 1

## 2022-05-27 MED ORDER — DEXAMETHASONE SODIUM PHOSPHATE 10 MG/ML IJ SOLN
INTRAMUSCULAR | Status: DC | PRN
  Administered 2022-05-27: 10 mg via INTRAVENOUS

## 2022-05-27 MED ORDER — EPHEDRINE SULFATE (PRESSORS) 50 MG/ML IJ SOLN
INTRAMUSCULAR | Status: DC | PRN
  Administered 2022-05-27: 5 mg via INTRAVENOUS

## 2022-05-27 MED ORDER — FENTANYL CITRATE (PF) 100 MCG/2ML IJ SOLN: 25.0000 ug | INTRAMUSCULAR | Status: DC | PRN

## 2022-05-27 MED ORDER — FENTANYL CITRATE (PF) 100 MCG/2ML IJ SOLN
INTRAMUSCULAR | Status: DC | PRN
  Administered 2022-05-27 (×3): 25 ug via INTRAVENOUS

## 2022-05-27 MED ORDER — SODIUM CHLORIDE 0.9 % IV SOLN
INTRAVENOUS | Status: DC | PRN
  Administered 2022-05-27: 500 mL

## 2022-05-27 MED ORDER — MIDAZOLAM HCL 2 MG/2ML IJ SOLN
INTRAMUSCULAR | Status: AC
  Filled 2022-05-27: qty 2

## 2022-05-27 MED ORDER — BUPIVACAINE-EPINEPHRINE (PF) 0.5% -1:200000 IJ SOLN
INTRAMUSCULAR | Status: DC | PRN
  Administered 2022-05-27: 20 mL

## 2022-05-27 MED ORDER — SODIUM CHLORIDE 0.9 % IV SOLN
INTRAVENOUS | Status: AC
  Filled 2022-05-27: qty 10

## 2022-05-27 MED ORDER — PROPOFOL 10 MG/ML IV BOLUS
INTRAVENOUS | Status: AC
  Filled 2022-05-27: qty 20

## 2022-05-27 MED ORDER — PROPOFOL 10 MG/ML IV BOLUS
INTRAVENOUS | Status: DC | PRN
  Administered 2022-05-27: 200 mg via INTRAVENOUS

## 2022-05-27 MED ORDER — LIDOCAINE HCL (CARDIAC) PF 100 MG/5ML IV SOSY
PREFILLED_SYRINGE | INTRAVENOUS | Status: DC | PRN
  Administered 2022-05-27: 100 mg via INTRAVENOUS

## 2022-05-27 MED ORDER — OXYCODONE HCL 5 MG/5ML PO SOLN: 5.0000 mg | Freq: Once | ORAL | Status: AC | PRN

## 2022-05-27 MED ORDER — OXYCODONE HCL 5 MG PO TABS
5.0000 mg | ORAL_TABLET | Freq: Once | ORAL | Status: AC | PRN
  Administered 2022-05-27: 5 mg via ORAL

## 2022-05-27 MED ORDER — ONDANSETRON HCL 4 MG/2ML IJ SOLN
INTRAMUSCULAR | Status: AC
  Filled 2022-05-27: qty 2

## 2022-05-27 MED ORDER — ONDANSETRON HCL 4 MG/2ML IJ SOLN: 4.0000 mg | Freq: Once | INTRAMUSCULAR | Status: DC | PRN

## 2022-05-27 MED ORDER — FENTANYL CITRATE (PF) 100 MCG/2ML IJ SOLN
INTRAMUSCULAR | Status: AC
  Filled 2022-05-27: qty 2

## 2022-05-27 MED ORDER — ACETAMINOPHEN 500 MG PO TABS
1000.0000 mg | ORAL_TABLET | ORAL | Status: AC
  Administered 2022-05-27: 1000 mg via ORAL

## 2022-05-27 SURGICAL SUPPLY — 49 items
ADH SKN CLS APL DERMABOND .7 (GAUZE/BANDAGES/DRESSINGS) ×1
APL PRP STRL LF DISP 70% ISPRP (MISCELLANEOUS) ×1
APPLIER CLIP 9.375 MED OPEN (MISCELLANEOUS)
APR CLP MED 9.3 20 MLT OPN (MISCELLANEOUS)
BINDER BREAST LRG (GAUZE/BANDAGES/DRESSINGS) IMPLANT
BINDER BREAST MEDIUM (GAUZE/BANDAGES/DRESSINGS) IMPLANT
BINDER BREAST XLRG (GAUZE/BANDAGES/DRESSINGS) IMPLANT
BINDER BREAST XXLRG (GAUZE/BANDAGES/DRESSINGS) IMPLANT
BLADE SURG 15 STRL LF DISP TIS (BLADE) ×1 IMPLANT
BLADE SURG 15 STRL SS (BLADE) ×1
CANISTER SUC SOCK COL 7IN (MISCELLANEOUS) IMPLANT
CANISTER SUCT 1200ML W/VALVE (MISCELLANEOUS) IMPLANT
CHLORAPREP W/TINT 26 (MISCELLANEOUS) ×1 IMPLANT
CLIP APPLIE 9.375 MED OPEN (MISCELLANEOUS) IMPLANT
COVER BACK TABLE 60X90IN (DRAPES) ×1 IMPLANT
COVER MAYO STAND STRL (DRAPES) ×1 IMPLANT
COVER PROBE CYLINDRICAL 5X96 (MISCELLANEOUS) ×1 IMPLANT
DERMABOND ADVANCED .7 DNX12 (GAUZE/BANDAGES/DRESSINGS) ×1 IMPLANT
DRAPE LAPAROSCOPIC ABDOMINAL (DRAPES) IMPLANT
DRAPE LAPAROTOMY 100X72 PEDS (DRAPES) ×1 IMPLANT
DRAPE UTILITY XL STRL (DRAPES) ×1 IMPLANT
ELECT COATED BLADE 2.86 ST (ELECTRODE) ×1 IMPLANT
ELECT REM PT RETURN 9FT ADLT (ELECTROSURGICAL) ×1
ELECTRODE REM PT RTRN 9FT ADLT (ELECTROSURGICAL) ×1 IMPLANT
GLOVE BIOGEL PI IND STRL 8 (GLOVE) ×1 IMPLANT
GLOVE ECLIPSE 8.0 STRL XLNG CF (GLOVE) ×1 IMPLANT
GOWN STRL REUS W/ TWL LRG LVL3 (GOWN DISPOSABLE) ×2 IMPLANT
GOWN STRL REUS W/ TWL XL LVL3 (GOWN DISPOSABLE) ×1 IMPLANT
GOWN STRL REUS W/TWL LRG LVL3 (GOWN DISPOSABLE) ×1
GOWN STRL REUS W/TWL XL LVL3 (GOWN DISPOSABLE) ×1
HEMOSTAT ARISTA ABSORB 3G PWDR (HEMOSTASIS) IMPLANT
HEMOSTAT SNOW SURGICEL 2X4 (HEMOSTASIS) IMPLANT
KIT MARKER MARGIN INK (KITS) ×1 IMPLANT
NDL HYPO 25X1 1.5 SAFETY (NEEDLE) ×1 IMPLANT
NEEDLE HYPO 25X1 1.5 SAFETY (NEEDLE) ×1 IMPLANT
NS IRRIG 1000ML POUR BTL (IV SOLUTION) ×1 IMPLANT
PACK BASIN DAY SURGERY FS (CUSTOM PROCEDURE TRAY) ×1 IMPLANT
PENCIL SMOKE EVACUATOR (MISCELLANEOUS) ×1 IMPLANT
SLEEVE SCD COMPRESS KNEE MED (STOCKING) ×1 IMPLANT
SPIKE FLUID TRANSFER (MISCELLANEOUS) IMPLANT
SPONGE T-LAP 4X18 ~~LOC~~+RFID (SPONGE) ×1 IMPLANT
SUT MNCRL AB 4-0 PS2 18 (SUTURE) ×1 IMPLANT
SUT SILK 2 0 SH (SUTURE) IMPLANT
SUT VICRYL 3-0 CR8 SH (SUTURE) ×1 IMPLANT
SYR CONTROL 10ML LL (SYRINGE) ×1 IMPLANT
TOWEL GREEN STERILE FF (TOWEL DISPOSABLE) ×1 IMPLANT
TRAY FAXITRON CT DISP (TRAY / TRAY PROCEDURE) ×1 IMPLANT
TUBE CONNECTING 20X1/4 (TUBING) IMPLANT
YANKAUER SUCT BULB TIP NO VENT (SUCTIONS) IMPLANT

## 2022-05-27 NOTE — Discharge Instructions (Addendum)
[facility] Office Phone Number [phone]  BREAST BIOPSY/ PARTIAL MASTECTOMY: POST OP INSTRUCTIONS  Always review your discharge instruction sheet given to you by the facility where your surgery was performed.  IF YOU HAVE DISABILITY OR FAMILY LEAVE FORMS, YOU MUST BRING THEM TO THE OFFICE FOR PROCESSING.  DO NOT GIVE THEM TO YOUR DOCTOR.  A prescription for pain medication may be given to you upon discharge.  Take your pain medication as prescribed, if needed.  If narcotic pain medicine is not needed, then you may take acetaminophen (Tylenol) or ibuprofen (Advil) as needed. Take your usually prescribed medications unless otherwise directed If you need a refill on your pain medication, please contact your pharmacy.  They will contact our office to request authorization.  Prescriptions will not be filled after 5pm or on week-ends. You should eat very light the first 24 hours after surgery, such as soup, crackers, pudding, etc.  Resume your normal diet the day after surgery. Most patients will experience some swelling and bruising in the breast.  Ice packs and a good support bra will help.  Swelling and bruising can take several days to resolve.  It is common to experience some constipation if taking pain medication after surgery.  Increasing fluid intake and taking a stool softener will usually help or prevent this problem from occurring.  A mild laxative (Milk of Magnesia or Miralax) should be taken according to package directions if there are no bowel movements after 48 hours. Unless discharge instructions indicate otherwise, you may remove your bandages 24-48 hours after surgery, and you may shower at that time.  You may have steri-strips (small skin tapes) in place directly over the incision.  These strips should be left on the skin for 7-10 days.  If your surgeon used skin glue on the incision, you may shower in 24 hours.  The glue will flake off over the next 2-3 weeks.  Any  sutures or staples will be removed at the office during your follow-up visit. ACTIVITIES:  You may resume regular daily activities (gradually increasing) beginning the next day.  Wearing a good support bra or sports bra minimizes pain and swelling.  You may have sexual intercourse when it is comfortable. You may drive when you no longer are taking prescription pain medication, you can comfortably wear a seatbelt, and you can safely maneuver your car and apply brakes. RETURN TO WORK:  ______________________________________________________________________________________ You should see your doctor in the office for a follow-up appointment approximately two weeks after your surgery.  Your doctor's nurse will typically make your follow-up appointment when she calls you with your pathology report.  Expect your pathology report 2-3 business days after your surgery.  You may call to check if you do not hear from us after three days. OTHER INSTRUCTIONS: _______________________________________________________________________________________________ _____________________________________________________________________________________________________________________________________ _____________________________________________________________________________________________________________________________________ _____________________________________________________________________________________________________________________________________  WHEN TO CALL YOUR DOCTOR: Fever over 101.0 Nausea and/or vomiting. Extreme swelling or bruising. Continued bleeding from incision. Increased pain, redness, or drainage from the incision.  The clinic staff is available to answer your questions during regular business hours.  Please don't hesitate to call and ask to speak to one of the nurses for clinical concerns.  If you have a medical emergency, go to the nearest emergency room or call 911.  A surgeon from [facility] is always on call at the hospital.  For further questions, please visit [email or web address]    Post Anesthesia Home Care Instructions  Activity: Get plenty of rest for the remainder of   of the day. A responsible individual must stay with you for 24 hours following the procedure.  For the next 24 hours, DO NOT: -Drive a car -Paediatric nurse -Drink alcoholic beverages -Take any medication unless instructed by your physician -Make any legal decisions or sign important papers.  Meals: Start with liquid foods such as gelatin or soup. Progress to regular foods as tolerated. Avoid greasy, spicy, heavy foods. If nausea and/or vomiting occur, drink only clear liquids until the nausea and/or vomiting subsides. Call your physician if vomiting continues.  Special Instructions/Symptoms: Your throat may feel dry or sore from the anesthesia or the breathing tube placed in your throat during surgery. If this causes discomfort, gargle with warm salt water. The discomfort should disappear within 24 hours.  Can take tylenol after 2:30 pm

## 2022-05-27 NOTE — Anesthesia Procedure Notes (Signed)
Procedure Name: LMA Insertion Date/Time: 05/27/2022 8:58 AM  Performed by: Lavonia Dana, CRNAPre-anesthesia Checklist: Patient identified, Emergency Drugs available, Suction available and Patient being monitored Patient Re-evaluated:Patient Re-evaluated prior to induction Oxygen Delivery Method: Circle system utilized Preoxygenation: Pre-oxygenation with 100% oxygen Induction Type: IV induction Ventilation: Mask ventilation without difficulty LMA: LMA inserted LMA Size: 4.0 Number of attempts: 1 Airway Equipment and Method: Bite block Placement Confirmation: positive ETCO2 Tube secured with: Tape Dental Injury: Teeth and Oropharynx as per pre-operative assessment

## 2022-05-27 NOTE — Anesthesia Postprocedure Evaluation (Signed)
Anesthesia Post Note  Patient: [name]  Procedure(s) Performed: RIGHT BREAST LUMPECTOMY WITH RADIOACTIVE SEED LOCALIZATION (Right: Breast)     Patient location during evaluation: PACU Anesthesia Type: General Level of consciousness: awake and alert Pain management: pain level controlled Vital Signs Assessment: post-procedure vital signs reviewed and stable Respiratory status: spontaneous breathing, nonlabored ventilation, respiratory function stable and patient connected to nasal cannula oxygen Cardiovascular status: blood pressure returned to baseline and stable Postop Assessment: no apparent nausea or vomiting Anesthetic complications: no  No notable events documented.  Last Vitals:  Vitals:   05/27/22 1015 05/27/22 1030  BP: (!) 156/103 (!) 149/90  Pulse: 77 70  Resp: 14 13  Temp:    SpO2: 97% 95%    Last Pain:  Vitals:   05/27/22 1028  TempSrc:   PainSc: 3                  Shuayb Schepers S

## 2022-05-27 NOTE — Op Note (Signed)
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  Further workup included diagnostic mammography and core biopsy which showed a focal area of atypical lobular hyperplasia.  We discussed the significance of this as well as options of observation versus excision.  We reviewed the potential upgrade risk of this lesion on core biopsy as well as natural history and potential high risk state that this interferes going forward.  After discussion above she wished to have the area removed.The procedure has been discussed with the patient. Alternatives to surgery have been discussed with the patient.  Risks of surgery include bleeding,  Infection,  Seroma formation, death,  and the need for further surgery.   The patient understands and wishes to proceed.  Description of procedure: The patient was met in the holding area.  The right breast was marked as the correct side and the seed was placed as an outpatient.  She was taken back to the operating room.  She was placed supine upon the operating room table.  After induction of LMA anesthesia, right breast was prepped and draped in a sterile fashion and timeout performed.  Proper patient, site and procedure verified.  Neoprobe was used to identify the seed right breast upper outer quadrant.  An local anesthetic was infiltrated over the signal and a curvilinear incision was made over the signal.  Dissection was carried down all tissue and the seed and clip were excised with a grossly negative margin.  The Faxitron image revealed the seed and clip to  be present.  The cavity was irrigated and made hemostatic with cautery.  Local anesthetic was infiltrated throughout.  The deep layer was closed with 3-0 Vicryl.  4 Monocryl was used to approximate the skin.  Dermabond applied.  All counts found to be correct.  The patient was then awoke extubated taken to recovery in satisfactory condition.  Breast binder was in place.

## 2022-05-27 NOTE — H&P (Signed)
History of Present Illness: [name] is a 57 y.o. female who is seen today as an office consultation for evaluation of New Consultation .   Patient presents for evaluation of her right breast mammogram abnormality. This was picked up her recent screening mammogram. There is area of density right breast upper outer quadrant core biopsy proven to be atypical lobular hyperplasia. No recent history of cancer in herself or family members. No history of breast pain, breast mass or nipple discharge.  Review of Systems: A complete review of systems was obtained from the patient. I have reviewed this information and discussed as appropriate with the patient. See HPI as well for other ROS.    Medical History: Past Medical History:  Diagnosis Date  Esophageal varices (CMS-HCC)  GERD (gastroesophageal reflux disease)   There is no problem list on file for this patient.  Past Surgical History:  Procedure Laterality Date  CESAREAN SECTION  knee surgery  REPEAT CESAREAN SECTION    No Known Allergies  Current Outpatient Medications on File Prior to Visit  Medication Sig Dispense Refill  buPROPion (WELLBUTRIN XL) 300 MG XL tablet  mirtazapine (REMERON) 30 MG tablet  pantoprazole (PROTONIX) 40 MG DR tablet Take 1 tablet by mouth once daily  buPROPion (WELLBUTRIN XL) 150 MG XL tablet Take 1 tablet by mouth once daily   No current facility-administered medications on file prior to visit.   Family History  Problem Relation Age of Onset  High blood pressure (Hypertension) Mother  Diabetes Mother    Social History   Tobacco Use  Smoking Status Never  Smokeless Tobacco Never    Social History   Socioeconomic History  Marital status: Married  Tobacco Use  Smoking status: Never  Smokeless tobacco: Never   Objective:   Vitals:  04/14/22 0931  BP: 120/78  Pulse: 82  Temp: 36.9 C (98.4 F)  SpO2: 98%  Weight: 87.8 kg (193 lb 9.6 oz)  Height: 160 cm ('5\' 3"'$ )   Body mass  index is 34.29 kg/m.  Physical Exam HENT:  Head: Normocephalic.  Eyes:  Pupils: Pupils are equal, round, and reactive to light.  Cardiovascular:  Rate and Rhythm: Normal rate.  Pulmonary:  Effort: Pulmonary effort is normal.  Chest:  Breasts: Right: Normal.  Left: Normal.  Musculoskeletal:  Cervical back: Normal range of motion.  Lymphadenopathy:  Upper Body:  Right upper body: No supraclavicular or axillary adenopathy.  Left upper body: No supraclavicular or axillary adenopathy.  Skin: General: Skin is warm.  Neurological:  General: No focal deficit present.  Mental Status: She is alert.  Psychiatric:  Mood and Affect: Mood normal.     Labs, Imaging and Diagnostic Testing:  Diagnosis Breast, right, needle core biopsy, outer FOCAL ATYPICAL LOBULAR HYPERPLASIA SHOWING DUCTAL INVOLVEMENT FIBROCYSTIC CHANGES INCLUDING STROMAL FIBROSIS WITH FOCAL CYSTIC DILATATION OF DUCTS AND USUAL DUCT HYPERPLASIA MICROCALCIFICATION PRESENT WITHIN BENIGN DUCT AND ADJACENT STROMA NEGATIVE FOR CARCINOMA Diagnosis Note Diagnosis called to Manuela Schwartz at [place] by Dr. Alric Seton on 03/26/2022 at 9:46 AM. Tobin Chad MD Pathologist, Electronic Signature (Case signed 03/26/2022) Specimen  CLINICAL DATA: Screening recall for a possible right breast distortion.  EXAM: DIGITAL DIAGNOSTIC UNILATERAL RIGHT MAMMOGRAM WITH TOMOSYNTHESIS; ULTRASOUND RIGHT BREAST LIMITED  TECHNIQUE: Right digital diagnostic mammography and breast tomosynthesis was performed.; Targeted ultrasound examination of the right breast was performed  COMPARISON: Previous exam(s).  ACR Breast Density Category c: The breast tissue is heterogeneously dense, which may obscure small masses.  FINDINGS: Spot  compression tomosynthesis images through the lateral middle depth of the right breast demonstrates no clear persistent evidence of distortion. Rolled medial and rolled lateral  full paddle cc images were performed demonstrating possible versus tint distortion, particularly in the rolled lateral view.  Ultrasound of the lateral right breast demonstrates no sonographic correlate for the appearance of distortion in the lateral aspect of the right breast.  Ultrasound of the right axilla demonstrates multiple normal-appearing lymph nodes.  IMPRESSION: 1. There is a possible subtle distortion in the lateral aspect of the right breast without a sonographic correlate.  2. No evidence of right axillary lymphadenopathy.  RECOMMENDATION: 1. Stereotactic biopsy is recommended for the possible right breast distortion. The patient is aware that this finding may not be able to be reproduced the day of biopsy. If that is the case, the procedure would be canceled, and a six-month follow-up mammogram would be recommended. The procedure has been scheduled for 03/25/2022.  I have discussed the findings and recommendations with the patient. If applicable, a reminder letter will be sent to the patient regarding the next appointment.  BI-RADS CATEGORY 4: Suspicious.   Electronically Signed By: Ammie Ferrier M.D. On: 03/11/2022 15:00  Assessment and Plan:   Diagnoses and all orders for this visit:  Atypical lobular hyperplasia [facility]) of right breast    Discussed observation, MRI, and lumpectomy. Discussed risk reduction as well. She is opted for right breast seed lumpectomy. Risks and benefits reviewed as well as expected outcomes and potential treatments and complications of surgery versus nonoperative management. Risk of bleeding, infection, cosmetic deformity, anesthesia risk, the need for the treatments and/or procedures reviewed with the patient today. Nonoperative measures as well as risk reduction and MRI also discussed. She agrees to proceed with right breast seed lumpectomy.  No follow-ups on file.  Kennieth Francois, MD

## 2022-05-27 NOTE — Transfer of Care (Signed)
Immediate Anesthesia Transfer of Care Note  Patient: [name]  Procedure(s) Performed: RIGHT BREAST LUMPECTOMY WITH RADIOACTIVE SEED LOCALIZATION (Right: Breast)  Patient Location: PACU  Anesthesia Type:General  Level of Consciousness: drowsy  Airway & Oxygen Therapy: Patient Spontanous Breathing and Patient connected to face mask oxygen  Post-op Assessment: Report given to RN and Post -op Vital signs reviewed and stable  Post vital signs: Reviewed and stable  Last Vitals:  Vitals Value Taken Time  BP 144/93 05/27/22 0958  Temp 36.3 C 05/27/22 0957  Pulse 77 05/27/22 0959  Resp 24 05/27/22 0959  SpO2 97 % 05/27/22 0959  Vitals shown include unvalidated device data.  Last Pain:  Vitals:   05/27/22 0827  TempSrc: Oral  PainSc: 0-No pain      Patients Stated Pain Goal: 3 (95/[id number])  Complications: No notable events documented.

## 2022-05-27 NOTE — Anesthesia Preprocedure Evaluation (Signed)
Anesthesia Evaluation  Patient identified by MRN, date of birth, ID band Patient awake    Reviewed: Allergy & Precautions, H&P , NPO status , Patient's Chart, lab work & pertinent test results  Airway Mallampati: II  TM Distance: >3 FB Neck ROM: Full    Dental no notable dental hx.    Pulmonary neg pulmonary ROS   Pulmonary exam normal breath sounds clear to auscultation       Cardiovascular negative cardio ROS Normal cardiovascular exam Rhythm:Regular Rate:Normal     Neuro/Psych   Anxiety Depression    negative neurological ROS     GI/Hepatic Neg liver ROS, PUD,GERD  Medicated,,  Endo/Other  negative endocrine ROS    Renal/GU negative Renal ROS  negative genitourinary   Musculoskeletal negative musculoskeletal ROS (+)    Abdominal   Peds negative pediatric ROS (+)  Hematology negative hematology ROS (+)   Anesthesia Other Findings   Reproductive/Obstetrics negative OB ROS                             Anesthesia Physical Anesthesia Plan  ASA: 2  Anesthesia Plan: General   Post-op Pain Management: Toradol IV (intra-op)*   Induction: Intravenous  PONV Risk Score and Plan: 3 and Ondansetron, Dexamethasone and Treatment may vary due to age or medical condition  Airway Management Planned: LMA  Additional Equipment:   Intra-op Plan:   Post-operative Plan: Extubation in OR  Informed Consent: I have reviewed the patients History and Physical, chart, labs and discussed the procedure including the risks, benefits and alternatives for the proposed anesthesia with the patient or authorized representative who has indicated his/her understanding and acceptance.     Dental advisory given  Plan Discussed with: CRNA and Surgeon  Anesthesia Plan Comments:        Anesthesia Quick Evaluation

## 2022-05-27 NOTE — Interval H&P Note (Signed)
History and Physical Interval Note:  05/27/2022 8:30 AM  Drucie Ip  has presented today for surgery, with the diagnosis of RIGHT ATYPICAL LOBULAR HYPERPLASIA.  The various methods of treatment have been discussed with the patient and family. After consideration of risks, benefits and other options for treatment, the patient has consented to  Procedure(s): RIGHT BREAST LUMPECTOMY WITH RADIOACTIVE SEED LOCALIZATION (Right) as a surgical intervention.  The patient's history has been reviewed, patient examined, no change in status, stable for surgery.  I have reviewed the patient's chart and labs.  Questions were answered to the patient's satisfaction.     Steinauer

## 2022-05-28 ENCOUNTER — Encounter (HOSPITAL_BASED_OUTPATIENT_CLINIC_OR_DEPARTMENT_OTHER): Payer: Self-pay | Admitting: Surgery

## 2022-05-28 LAB — SURGICAL PATHOLOGY

## 2022-07-08 ENCOUNTER — Encounter (HOSPITAL_COMMUNITY): Payer: Self-pay

## 2022-07-31 ENCOUNTER — Ambulatory Visit: Payer: BC Managed Care – PPO | Admitting: Gastroenterology

## 2022-07-31 ENCOUNTER — Encounter: Payer: Self-pay | Admitting: Gastroenterology

## 2022-07-31 DIAGNOSIS — K222 Esophageal obstruction: Secondary | ICD-10-CM

## 2022-07-31 DIAGNOSIS — R1319 Other dysphagia: Secondary | ICD-10-CM

## 2022-07-31 MED ORDER — LINACLOTIDE 145 MCG PO CAPS: 145.0000 ug | ORAL_CAPSULE | Freq: Every day | ORAL | 2 refills | Status: DC

## 2022-07-31 MED ORDER — PANTOPRAZOLE SODIUM 40 MG PO TBEC: 40.0000 mg | DELAYED_RELEASE_TABLET | Freq: Every day | ORAL | 3 refills | Status: DC

## 2022-07-31 NOTE — Progress Notes (Signed)
    Assessment     Constipation GERD, possible EoE, history of esophageal stricture   Recommendations    Begin Linzess 145 mcg p.o. daily, contact us if symptoms are not adequately controlled Continue pantoprazole 40 mg daily and follow antireflux measures Screening colonoscopy due in April 2025 REV in 6 weeks   HPI    This is a 57 year old female with constipation.  She relates ongoing problems with constipation and has a bowel movement about every 2 weeks.  She takes MiraLAX every other night and occasionally another over-the-counter laxative that she does not recall the name of.  She relates significant lower abdominal pain when bowel movements occur infrequently.  Pain is relieved with a bowel movement.  Her reflux symptoms and dysphagia are well-controlled on daily pantoprazole. Denies weight loss, abdominal pain, diarrhea, change in stool caliber, melena, hematochezia, nausea, vomiting, chest pain.   Labs / Imaging        No data to display             Latest Ref Rng & Units 12/14/2017    9:12 PM  CBC  Hemoglobin 12.0 - 15.0 g/dL 14.3   Hematocrit 36.0 - 46.0 % 42.0     Current Medications, Allergies, Past Medical History, Past Surgical History, Family History and Social History were reviewed in [employer] record.   Physical Exam: General: Well developed, well nourished, no acute distress Head: Normocephalic and atraumatic Eyes: Sclerae anicteric, EOMI Ears: Normal auditory acuity Mouth: No deformities or lesions noted Lungs: Clear throughout to auscultation Heart: Regular rate and rhythm; No murmurs, rubs or bruits Abdomen: Soft, non tender and non distended. No masses, hepatosplenomegaly or hernias noted. Normal Bowel sounds Rectal: Not done Musculoskeletal: Symmetrical with no gross deformities  Pulses:  Normal pulses noted Extremities: No edema or deformities noted Neurological: Alert oriented x 4, grossly nonfocal Psychological:   Alert and cooperative. Normal mood and affect   Abdulloh Ullom T. Fuller Plan, MD 07/31/2022, 9:27 AM

## 2022-07-31 NOTE — Patient Instructions (Addendum)
We have scheduled you for a 6-week follow up on 09/10/2022 at 3:00pm. Please call us in a couple weeks if the Linzess isn't effective.  We have sent the following medications to your pharmacy for you to pick up at your convenience: Linzess Pantroprazole   _______________________________________________________  If your blood pressure at your visit was 140/90 or greater, please contact your primary care physician to follow up on this.  _______________________________________________________  If you are age 56 or older, your body mass index should be between 23-30. Your Body mass index is 34.19 kg/m. If this is out of the aforementioned range listed, please consider follow up with your Primary Care Provider.  If you are age 29 or younger, your body mass index should be between 19-25. Your Body mass index is 34.19 kg/m. If this is out of the aformentioned range listed, please consider follow up with your Primary Care Provider.   ________________________________________________________  The [facility] providers would like to encourage you to use [facility] to communicate with providers for non-urgent requests or questions.  Due to long hold times on the telephone, sending your provider a message by [facility] may be a faster and more efficient way to get a response.  Please allow 48 business hours for a response.  Please remember that this is for non-urgent requests.  _______________________________________________________ Thank you for choosing me and [facility].  Pricilla Riffle. Dagoberto Ligas., MD., Marval Regal

## 2022-09-10 ENCOUNTER — Ambulatory Visit: Payer: BC Managed Care – PPO | Admitting: Gastroenterology

## 2022-12-04 ENCOUNTER — Other Ambulatory Visit: Payer: Self-pay

## 2022-12-04 MED ORDER — LINACLOTIDE 145 MCG PO CAPS: 145.0000 ug | ORAL_CAPSULE | Freq: Every day | ORAL | 5 refills | Status: DC

## 2023-05-22 IMAGING — US US BREAST*L* LIMITED INC AXILLA
1 series · 6 of 6 positions shown · non-contrast
Comparison: Previous exams.

CLINICAL DATA: Screening recall for possible left breast mass.

EXAM:
DIGITAL DIAGNOSTIC UNILATERAL LEFT MAMMOGRAM WITH TOMOSYNTHESIS AND
CAD; ULTRASOUND LEFT BREAST LIMITED
TECHNIQUE: Left digital diagnostic mammography and breast tomosynthesis was
performed. The images were evaluated with computer-aided detection.;
Targeted ultrasound examination of the left breast was performed.

[Series 1: us breast*left* limited inc axilla · 0.06mm/px · 6 of 6 slices shown]
[im 1/6]
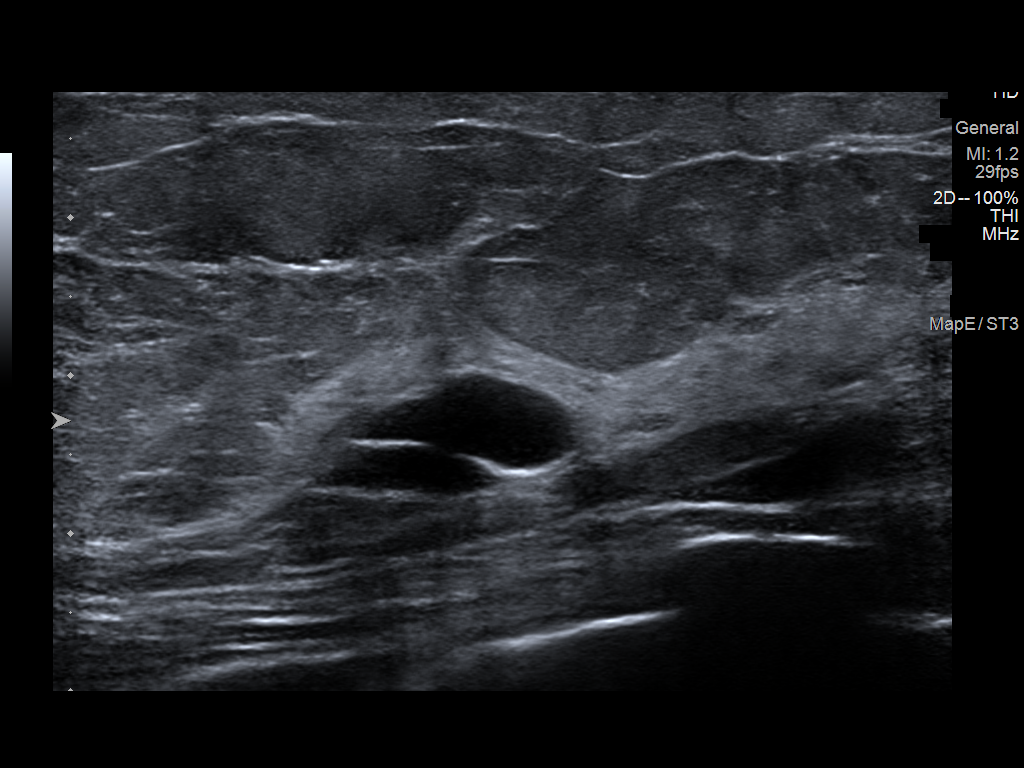
[im 2/6]
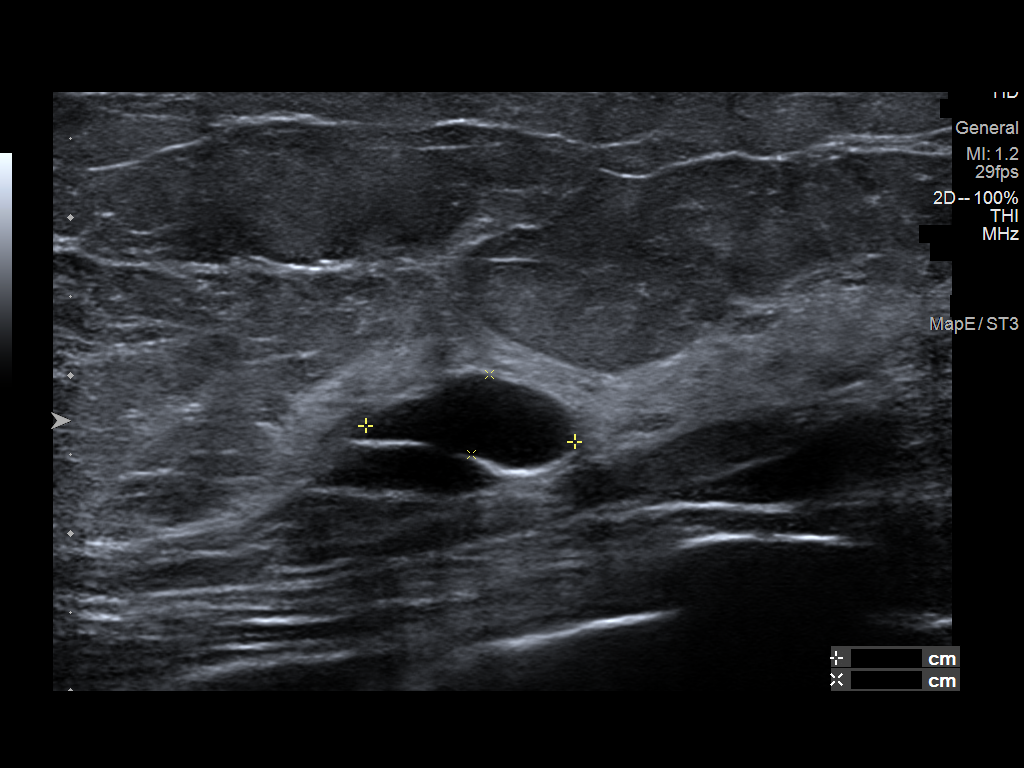
[im 3/6]
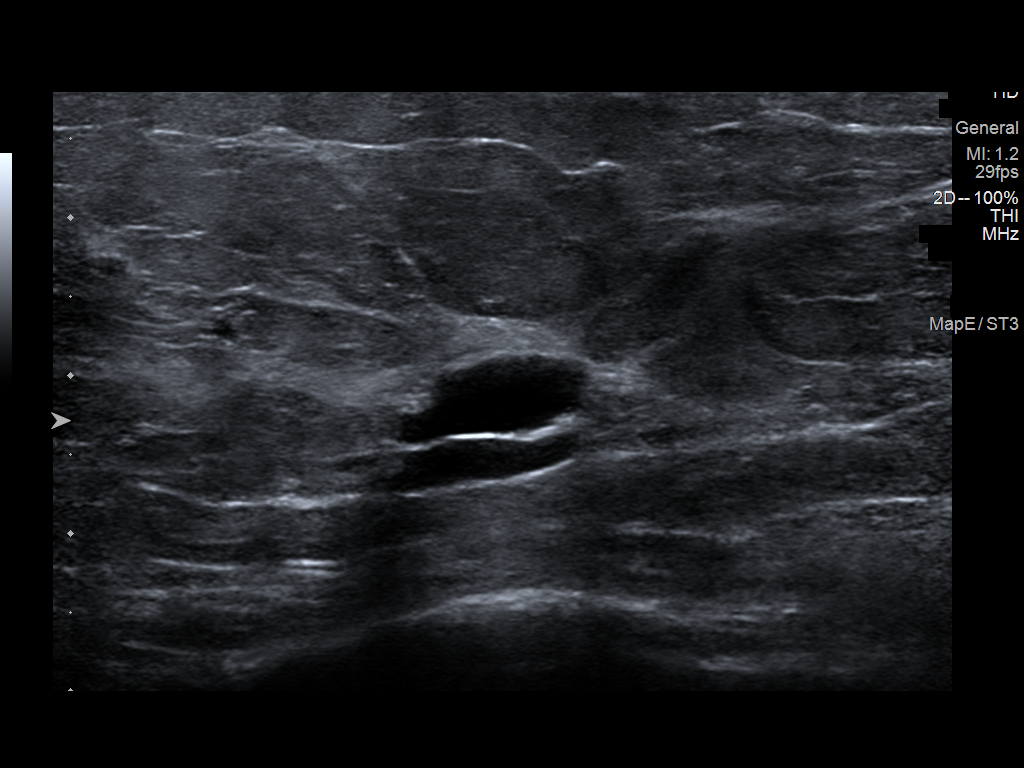
[im 4/6]
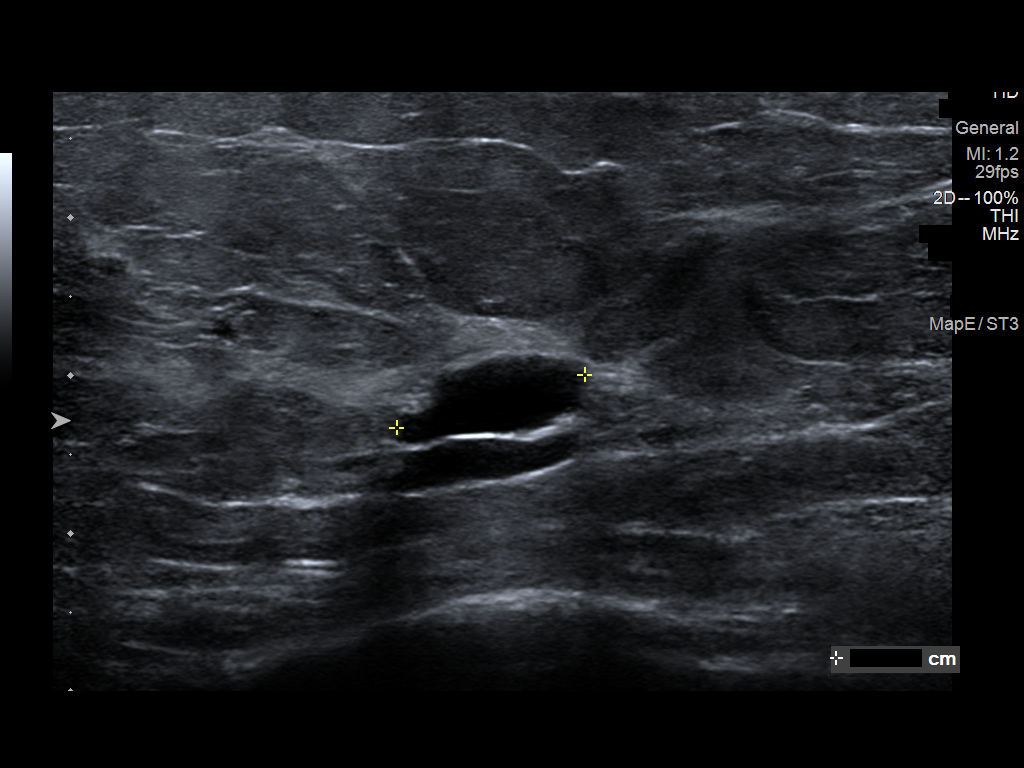
[im 5/6]
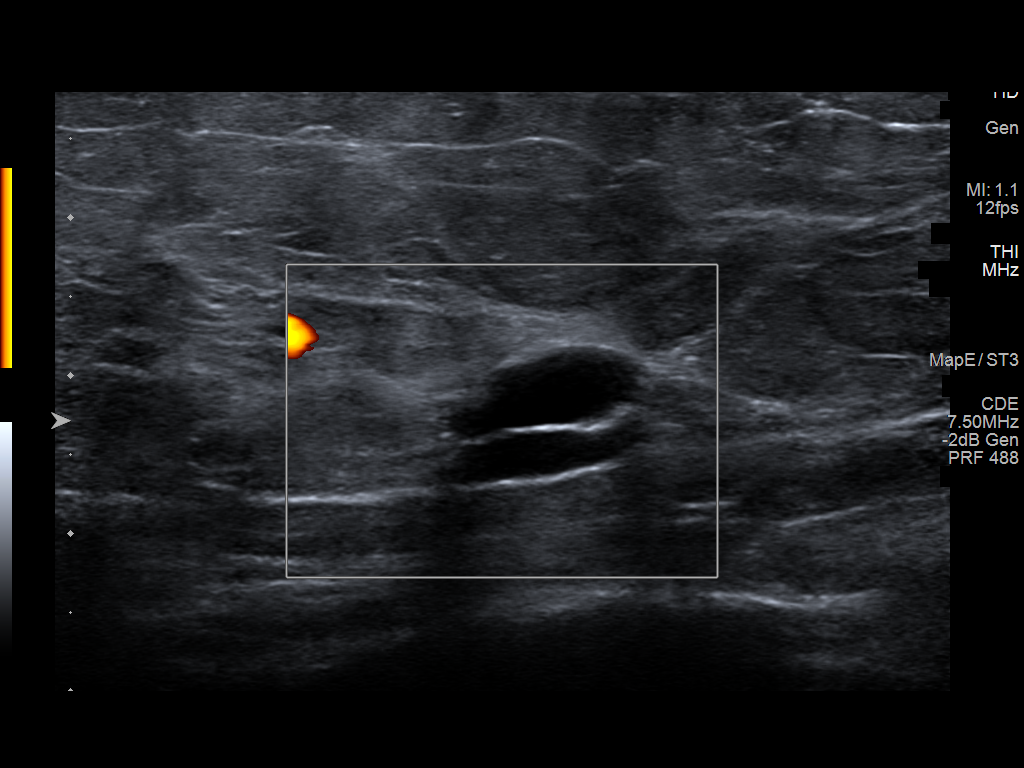
[im 6/6]
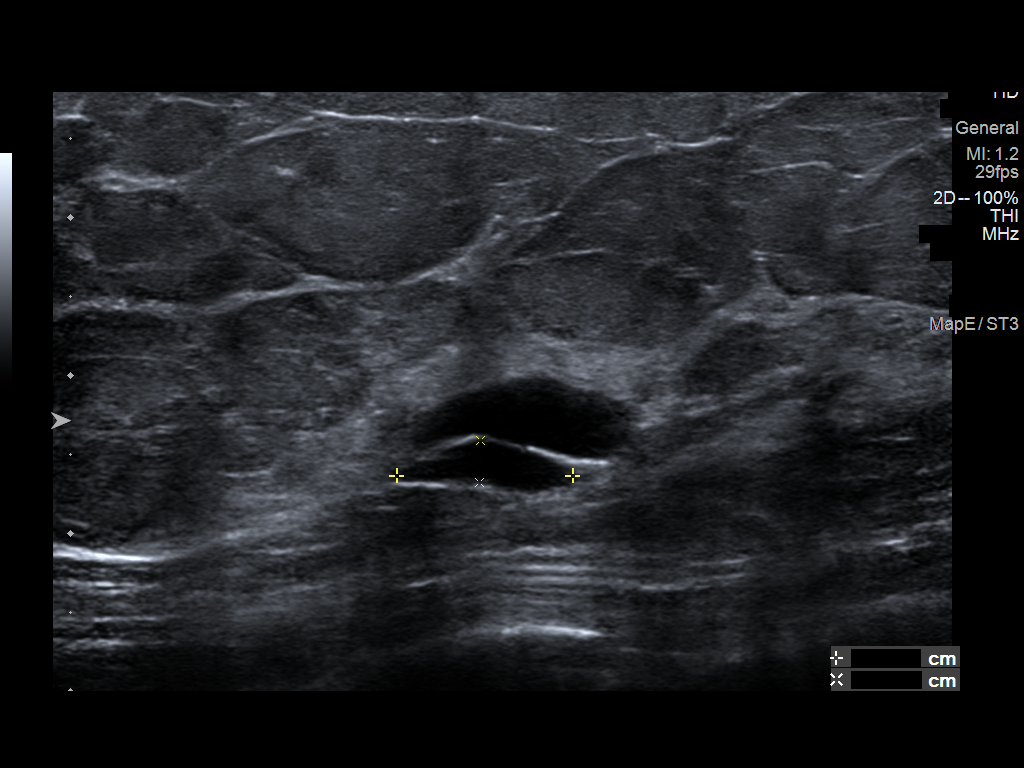

[6 of 6 positions shown; findings below may reference images not displayed]

ACR Breast Density Category c: The breast tissue is heterogeneously
dense, which may obscure small masses.
FINDINGS: Spot compression tomograms were performed of the left breast. There
is an oval mass with obscured margins in the outer left breast
measuring approximately 2 cm.

Targeted ultrasound of the outer left breast was performed
demonstrating several areas of fibrocystic change. A cyst versus 2
adjacent cysts in the left breast at 3 o'clock 6 cm from nipple
measures 1.8 x 0.7 x 1.2 cm. This corresponds well with the mass
seen in the outer left breast at mammography.
IMPRESSION: No findings of malignancy in the left breast.

RECOMMENDATION:
Screening mammogram in one year.(Code:HJ-0-N5M)

I have discussed the findings and recommendations with the patient.
If applicable, a reminder letter will be sent to the patient
regarding the next appointment.

BI-RADS CATEGORY  2: Benign.

## 2023-05-22 IMAGING — MG MM DIGITAL DIAGNOSTIC UNILAT*L* W/ TOMO W/ CAD
4 series · 4 of 12 positions shown · non-contrast
Comparison: Previous exams.

CLINICAL DATA: Screening recall for possible left breast mass.

EXAM:
DIGITAL DIAGNOSTIC UNILATERAL LEFT MAMMOGRAM WITH TOMOSYNTHESIS AND
CAD; ULTRASOUND LEFT BREAST LIMITED
TECHNIQUE: Left digital diagnostic mammography and breast tomosynthesis was
performed. The images were evaluated with computer-aided detection.;
Targeted ultrasound examination of the left breast was performed.

[L MLO synth-2D]
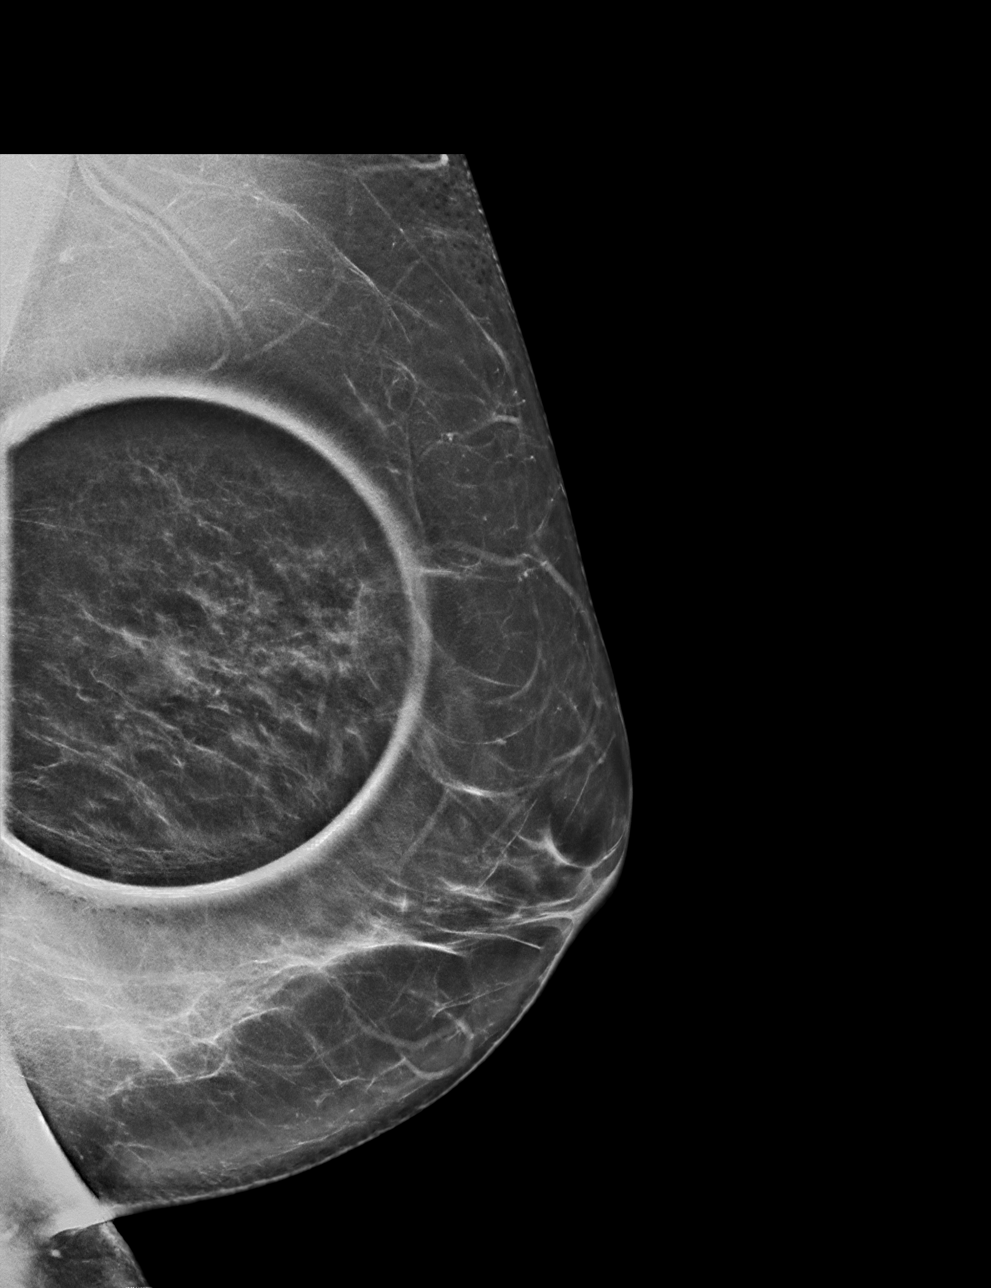

[L CC synth-2D]
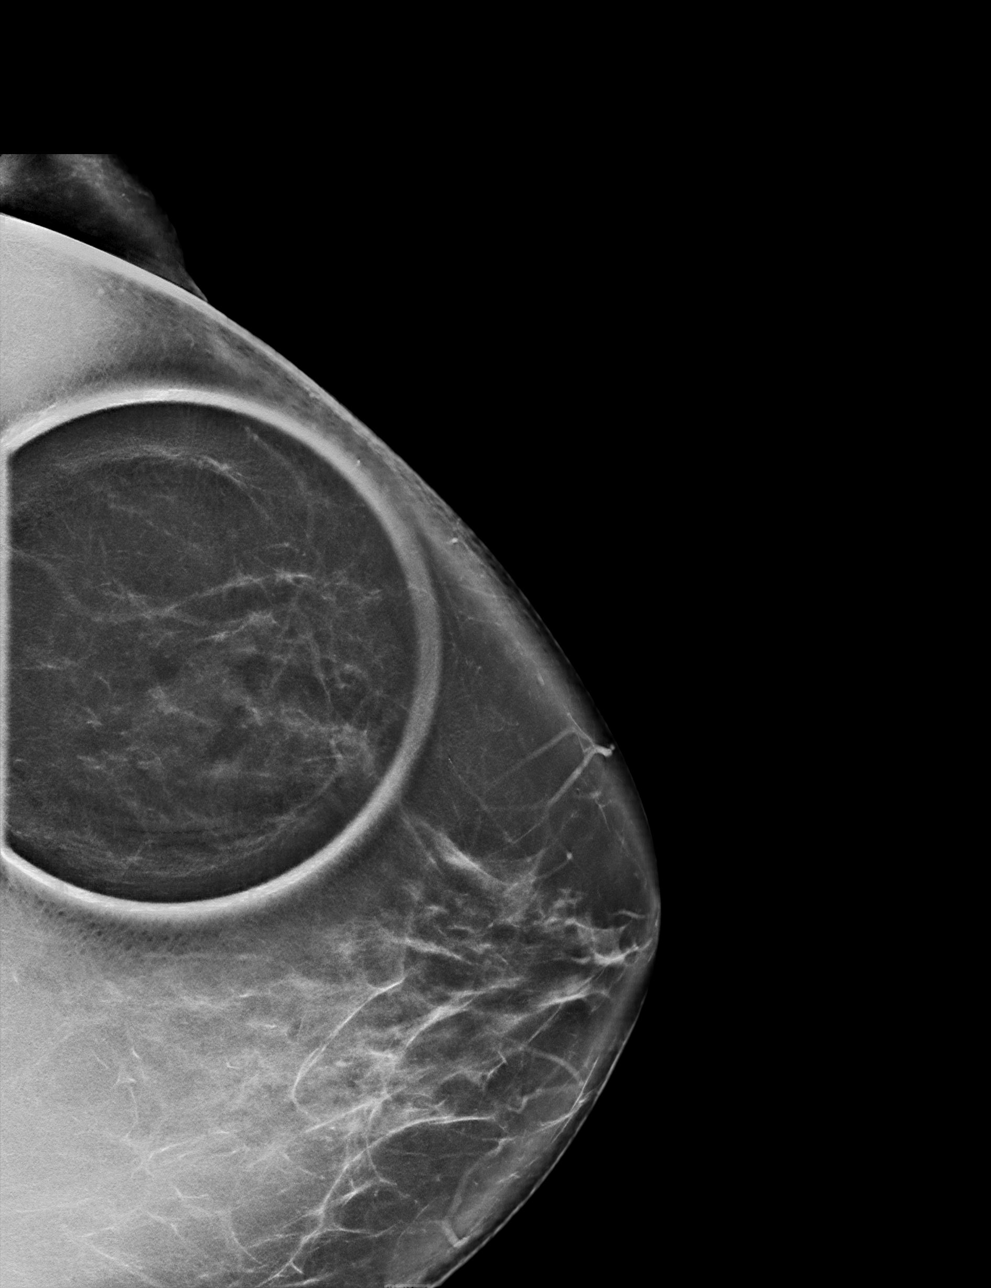

[L CC tomo · tomo slice 47/92.0]
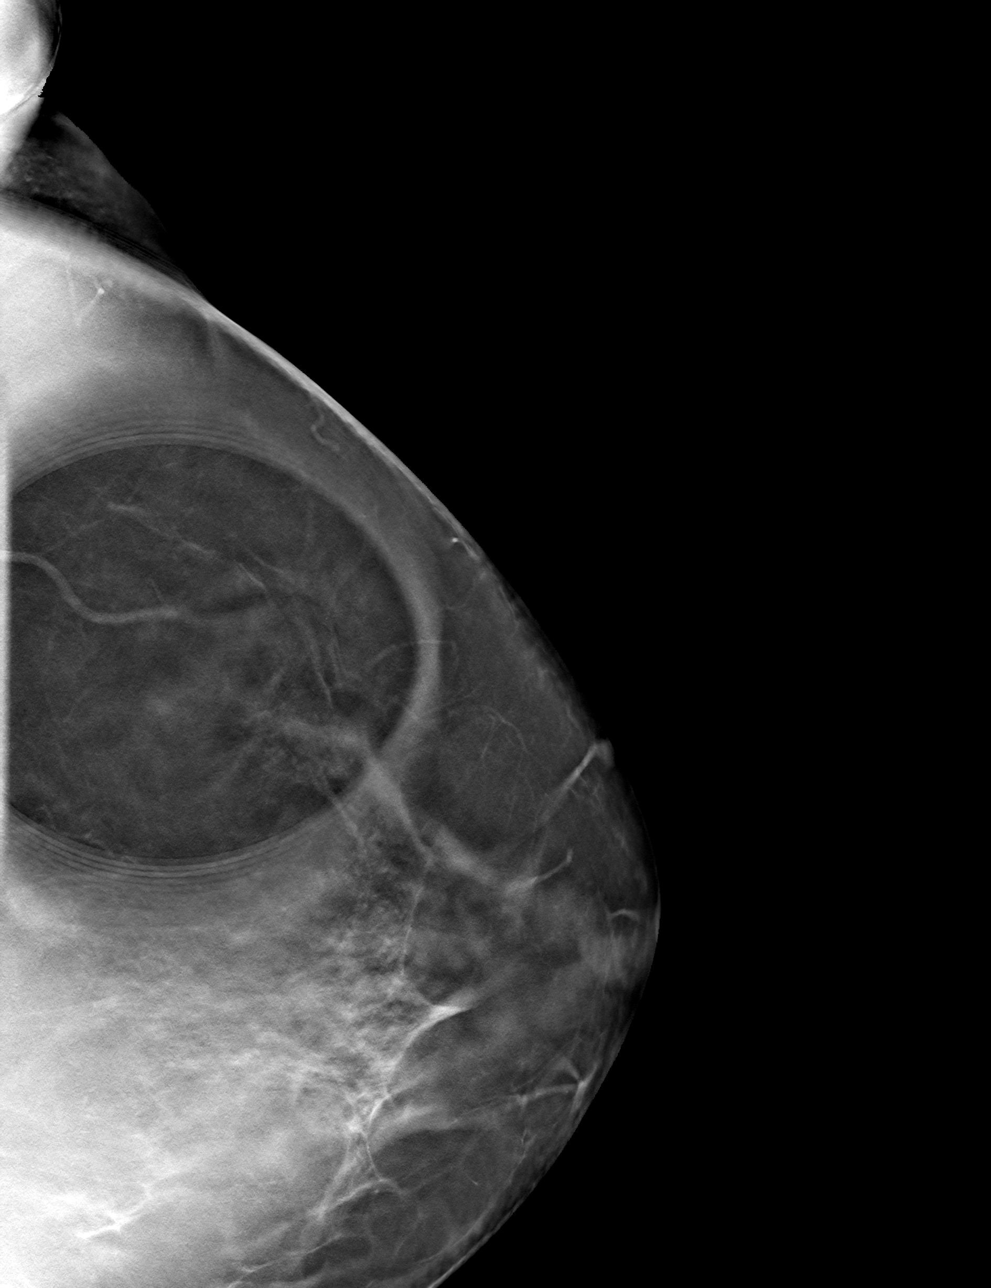

[L MLO tomo · tomo slice 39/77.0]
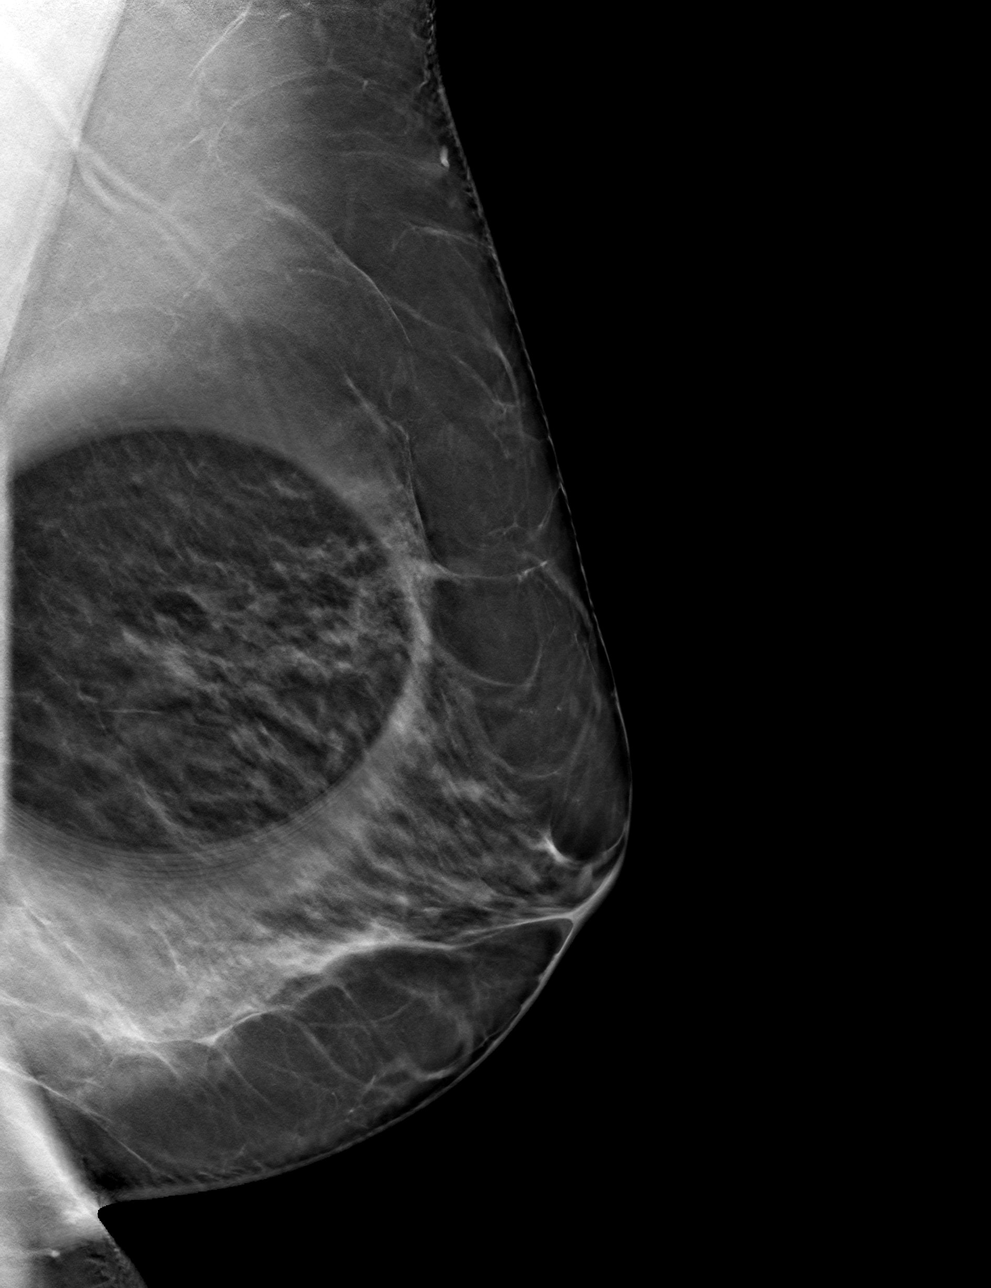

[4 of 12 positions shown; findings below may reference images not displayed]

ACR Breast Density Category c: The breast tissue is heterogeneously
dense, which may obscure small masses.
FINDINGS: Spot compression tomograms were performed of the left breast. There
is an oval mass with obscured margins in the outer left breast
measuring approximately 2 cm.

Targeted ultrasound of the outer left breast was performed
demonstrating several areas of fibrocystic change. A cyst versus 2
adjacent cysts in the left breast at 3 o'clock 6 cm from nipple
measures 1.8 x 0.7 x 1.2 cm. This corresponds well with the mass
seen in the outer left breast at mammography.
IMPRESSION: No findings of malignancy in the left breast.

RECOMMENDATION:
Screening mammogram in one year.(Code:HJ-0-N5M)

I have discussed the findings and recommendations with the patient.
If applicable, a reminder letter will be sent to the patient
regarding the next appointment.

BI-RADS CATEGORY  2: Benign.

## 2023-09-25 ENCOUNTER — Telehealth: Payer: Self-pay | Admitting: Gastroenterology

## 2023-09-25 DIAGNOSIS — R1319 Other dysphagia: Secondary | ICD-10-CM

## 2023-09-25 DIAGNOSIS — K222 Esophageal obstruction: Secondary | ICD-10-CM

## 2023-09-25 MED ORDER — LINACLOTIDE 145 MCG PO CAPS: 145.0000 ug | ORAL_CAPSULE | Freq: Every day | ORAL | 0 refills | Status: DC

## 2023-09-25 MED ORDER — PANTOPRAZOLE SODIUM 40 MG PO TBEC: 40.0000 mg | DELAYED_RELEASE_TABLET | Freq: Every day | ORAL | 0 refills | Status: DC

## 2023-09-25 NOTE — Telephone Encounter (Signed)
 PT is calling to have a refill for Linzess  and pantoprazole  sent to [facility]. Please advise.

## 2023-09-25 NOTE — Telephone Encounter (Signed)
 She is overdue for screening colonoscopy which was due last month.  Dr. Sandrea Cruel and recommended she do that in April.  That needs to be arranged with a new provider.  She could have Linzess  prescription refills to last until around that time or a little bit after.

## 2023-09-25 NOTE — Telephone Encounter (Signed)
 Prescription for Linzess  and pantoprazole  sent to [facility] pharmacy. Also, patient scheduled colonoscopy on 11/12/23 at 2:00 pm and pre-visit on 10/27/23 9:00 am.

## 2023-09-25 NOTE — Telephone Encounter (Signed)
 Patient is requesting a refill of Linzess  and pantoprazole  be sent to [facility]. Patient is a previous Dr. Sandrea Cruel patient. Dr. Willy Harvest, you are DOD. Can patient have refills?

## 2023-10-27 ENCOUNTER — Encounter

## 2023-10-27 ENCOUNTER — Ambulatory Visit (AMBULATORY_SURGERY_CENTER)

## 2023-10-27 ENCOUNTER — Encounter: Payer: Self-pay | Admitting: Internal Medicine

## 2023-10-27 VITALS — Ht 64.0 in | Wt 178.7 lb

## 2023-10-27 DIAGNOSIS — Z1211 Encounter for screening for malignant neoplasm of colon: Secondary | ICD-10-CM

## 2023-10-27 MED ORDER — NA SULFATE-K SULFATE-MG SULF 17.5-3.13-1.6 GM/177ML PO SOLN: 1.0000 | Freq: Once | ORAL | 0 refills | Status: AC

## 2023-10-27 NOTE — Progress Notes (Signed)
 No egg or soy allergy known to patient  No issues known to pt with past sedation with any surgeries or procedures Patient denies ever being told they had issues or difficulty with intubation  No FH of Malignant Hyperthermia Pt is not on diet pills Pt is not on  home 02  Pt is not on blood thinners  Pt has issues with constipation and takes Linzess No A fib or A flutter Have any cardiac testing pending--no Pt can ambulate independently Pt denies use of chewing tobacco Discussed diabetic I weight loss medication holds Discussed NSAID holds Checked BMI Pt instructed to use Single[employer] or GoodRx for a price reduction on prep  Patient's chart reviewed by [name] CNRA prior to previsit and patient appropriate for the [facility].  Pre visit completed and red dot placed by patient's name on their procedure day (on provider's schedule).

## 2023-11-11 NOTE — Progress Notes (Unsigned)
 [facility] History and Physical   Primary Care Physician:  Johnnye Ade, MD   Reason for Procedure:  Colon cancer screening  Plan:    Colonoscopy     HPI: [name] is a 58 y.o. female previously cared for by Dr. Aneita, she had a screening colonoscopy in 2015 showing hemorrhoids but no polyps or cancer.  For a repeat screening exam today. 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  Status post Savary dilation.  Fundic gland polyps also found.  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    Depression    Eosinophilic esophagitis 05/19/2009   Esophageal stricture    Esophageal tear    Fundic gland polyps of stomach, benign    GERD (gastroesophageal reflux disease)    Hemorrhoids    Hyperlipidemia    no meds   Hypertension    Stomach ulcer 05/19/2006    Past Surgical History:  Procedure Laterality Date   BREAST BIOPSY Right 03/25/2022   MM RT BREAST BX W LOC DEV 1ST LESION IMAGE BX SPEC STEREO GUIDE 03/25/2022 [facility]   BREAST BIOPSY  05/26/2022   MM RT RADIOACTIVE SEED LOC MAMMO GUIDE 05/26/2022 [facility]   BREAST LUMPECTOMY WITH RADIOACTIVE SEED LOCALIZATION Right 05/27/2022   Procedure: RIGHT BREAST LUMPECTOMY WITH RADIOACTIVE SEED LOCALIZATION;  Surgeon: Vanderbilt Ned, MD;  Location: [facility];  Service: General;  Laterality: Right;   CESAREAN SECTION  8004,8002   COLONOSCOPY     DILATION AND CURETTAGE OF UTERUS     ESOPHAGOGASTRODUODENOSCOPY N/A 12/14/2017   Procedure: ESOPHAGOGASTRODUODENOSCOPY (EGD);  Surgeon: Albertus Gordy HERO, MD;  Location: [facility];  Service: Gastroenterology;  Laterality: N/A;   KNEE SURGERY  1986   TUBAL LIGATION     UPPER GASTROINTESTINAL ENDOSCOPY      Prior to Admission medications   Medication Sig Start Date End Date Taking? Authorizing Provider  atorvastatin (LIPITOR) 10 MG tablet  Take 10 mg by mouth daily.    [provider]  buPROPion (WELLBUTRIN XL) 150 MG 24 hr tablet Take 1 tablet by mouth daily.    [provider]  buPROPion (WELLBUTRIN XL) 300 MG 24 hr tablet Take 300 mg by mouth daily.  02/26/16   [provider]  linaclotide  (LINZESS ) 145 MCG CAPS capsule Take 1 capsule (145 mcg total) by mouth daily before breakfast. 09/25/23   Avram Lupita BRAVO, MD  mirtazapine (REMERON) 30 MG tablet Take 30 mg by mouth at bedtime. 01/31/15   [provider]  pantoprazole  (PROTONIX ) 40 MG tablet Take 1 tablet (40 mg total) by mouth daily. 09/25/23   Avram Lupita BRAVO, MD    Current Outpatient Medications  Medication Sig Dispense Refill   atorvastatin (LIPITOR) 10 MG tablet Take 10 mg by mouth daily.     buPROPion (WELLBUTRIN XL) 150 MG 24 hr tablet Take 1 tablet by mouth daily.     buPROPion (WELLBUTRIN XL) 300 MG 24 hr tablet Take 300 mg by mouth daily.      linaclotide  (LINZESS ) 145 MCG CAPS capsule Take 1 capsule (145 mcg total) by mouth daily before breakfast. 90 capsule 0   mirtazapine (REMERON) 30 MG tablet Take 30 mg by mouth at bedtime.     pantoprazole  (PROTONIX ) 40 MG tablet Take 1 tablet (40 mg total) by mouth daily. 90 tablet 0   No current facility-administered medications for this  visit.    Allergies as of 11/12/2023   (No Known Allergies)    Family History  Problem Relation Age of Onset   Heart disease Mother    Diabetes Mother    Colon cancer Neg Hx    Esophageal cancer Neg Hx    Stomach cancer Neg Hx    Rectal cancer Neg Hx    Colon polyps Neg Hx     Social History   Socioeconomic History   Marital status: Married    Spouse name: Not on file   Number of children: 2   Years of education: Not on file   Highest education level: Not on file  Occupational History   Occupation: Housewife  Tobacco Use   Smoking status: Never   Smokeless tobacco: Never  Vaping Use   Vaping status: Never Used  Substance and Sexual  Activity   Alcohol use: No   Drug use: No   Sexual activity: Yes    Birth control/protection: Surgical    Comment: BTL, ablation  Other Topics Concern   Not on file  Social History Narrative   Not on file   Social Drivers of Health   Financial Resource Strain: Not on file  Food Insecurity: Not on file  Transportation Needs: Not on file  Physical Activity: Not on file  Stress: Not on file  Social Connections: Not on file  Intimate Partner Violence: Not on file    Review of Systems: Positive for *** All other review of systems negative except as mentioned in the HPI.  Physical Exam: Vital signs There were no vitals taken for this visit.  General:   Alert,  Well-developed, well-nourished, pleasant and cooperative in NAD Lungs:  Clear throughout to auscultation.   Heart:  Regular rate and rhythm; no murmurs, clicks, rubs,  or gallops. Abdomen:  Soft, nontender and nondistended. Normal bowel sounds.   Neuro/Psych:  Alert and cooperative. Normal mood and affect. A and O x 3   @Javarion Douty  CHARLENA Commander, MD, [facility] [phone] (pager) 11/11/2023 6:36 PM@

## 2023-11-12 ENCOUNTER — Encounter: Payer: Self-pay | Admitting: Internal Medicine

## 2023-11-12 ENCOUNTER — Ambulatory Visit: Admitting: Internal Medicine

## 2023-11-12 VITALS — BP 136/74 | HR 63 | Temp 98.1°F | Resp 14 | Ht 64.0 in | Wt 178.0 lb

## 2023-11-12 DIAGNOSIS — K6289 Other specified diseases of anus and rectum: Secondary | ICD-10-CM | POA: Diagnosis not present

## 2023-11-12 DIAGNOSIS — Z1211 Encounter for screening for malignant neoplasm of colon: Secondary | ICD-10-CM

## 2023-11-12 DIAGNOSIS — D122 Benign neoplasm of ascending colon: Secondary | ICD-10-CM | POA: Diagnosis not present

## 2023-11-12 MED ORDER — SODIUM CHLORIDE 0.9 % IV SOLN: 500.0000 mL | Freq: Once | INTRAVENOUS | Status: DC

## 2023-11-12 MED ORDER — LINACLOTIDE 290 MCG PO CAPS: 290.0000 ug | ORAL_CAPSULE | Freq: Every day | ORAL | 3 refills | Status: AC

## 2023-11-12 NOTE — Patient Instructions (Addendum)
 I found to remove 1 very tiny polyp that looks benign.  I will have it analyzed and let you know what it was and what that means as far as when to repeat a routine colonoscopy.  The earliest should be 7 years from now I think.  The colonoscopy exam was otherwise normal.  When I examined the rectum there were some signs that the rectal function may not be normal.  A specialized physical therapy called pelvic floor physical therapy could help with that and improve your constipation problems.  We can make a referral if you are interested.  I also think you should try a higher dose of Linzess  so we will prescribe that at 290 mcg daily.  I appreciate the opportunity to care for you. Lupita CHARLENA Commander, MD, FACG  YOU HAD AN ENDOSCOPIC PROCEDURE TODAY AT THE [facility]:   Refer to the procedure report that was given to you for any specific questions about what was found during the examination.  If the procedure report does not answer your questions, please call your gastroenterologist to clarify.  If you requested that your care partner not be given the details of your procedure findings, then the procedure report has been included in a sealed envelope for you to review at your convenience later.  YOU SHOULD EXPECT: Some feelings of bloating in the abdomen. Passage of more gas than usual.  Walking can help get rid of the air that was put into your GI tract during the procedure and reduce the bloating. If you had a lower endoscopy (such as a colonoscopy or flexible sigmoidoscopy) you may notice spotting of blood in your stool or on the toilet paper. If you underwent a bowel prep for your procedure, you may not have a normal bowel movement for a few days.  Please Note:  You might notice some irritation and congestion in your nose or some drainage.  This is from the oxygen used during your procedure.  There is no need for concern and it should clear up in a day or so.  SYMPTOMS TO REPORT  IMMEDIATELY:  Following lower endoscopy (colonoscopy or flexible sigmoidoscopy):  Excessive amounts of blood in the stool  Significant tenderness or worsening of abdominal pains  Swelling of the abdomen that is new, acute  Fever of 100F or higher  For urgent or emergent issues, a gastroenterologist can be reached at any hour by calling [phone]. Do not use MyChart messaging for urgent concerns.    DIET:  We do recommend a small meal at first, but then you may proceed to your regular diet.  Drink plenty of fluids but you should avoid alcoholic beverages for 24 hours.  ACTIVITY:  You should plan to take it easy for the rest of today and you should NOT DRIVE or use heavy machinery until tomorrow (because of the sedation medicines used during the test).    FOLLOW UP: Our staff will call the number listed on your records the next business day following your procedure.  We will call around 7:15- 8:00 am to check on you and address any questions or concerns that you may have regarding the information given to you following your procedure. If we do not reach you, we will leave a message.     If any biopsies were taken you will be contacted by phone or by letter within the next 1-3 weeks.  Please call us  at [phone] if you have not heard about the biopsies  in 3 weeks.    SIGNATURES/CONFIDENTIALITY: You and/or your care partner have signed paperwork which will be entered into your electronic medical record.  These signatures attest to the fact that that the information above on your After Visit Summary has been reviewed and is understood.  Full responsibility of the confidentiality of this discharge information lies with you and/or your care-partner.

## 2023-11-12 NOTE — Progress Notes (Signed)
 To pacu, VSS. Report to [email or web address]

## 2023-11-12 NOTE — Progress Notes (Signed)
 Called to room to assist during endoscopic procedure.  Patient ID and intended procedure confirmed with present staff. Received instructions for my participation in the procedure from the performing physician.

## 2023-11-12 NOTE — Op Note (Addendum)
 [facility] Patient Name: [name] Procedure Date: 11/12/2023 3:33 PM MRN: [id number] Endoscopist: Lupita FORBES Commander , MD, [id number] Age: 58 Referring MD:  Date of Birth: Jul 16, 1965 Gender: Female Account #: [id number] Procedure:                Colonoscopy Indications:              Screening for colorectal malignant neoplasm, Last                            colonoscopy: 2015 Medicines:                Monitored Anesthesia Care Procedure:                Pre-Anesthesia Assessment:                           - Prior to the procedure, a History and Physical                            was performed, and patient medications and                            allergies were reviewed. The patient's tolerance of                            previous anesthesia was also reviewed. The risks                            and benefits of the procedure and the sedation                            options and risks were discussed with the patient.                            All questions were answered, and informed consent                            was obtained. Prior Anticoagulants: The patient has                            taken no anticoagulant or antiplatelet agents. ASA                            Grade Assessment: II - A patient with mild systemic                            disease. After reviewing the risks and benefits,                            the patient was deemed in satisfactory condition to                            undergo the procedure.  After obtaining informed consent, the colonoscope                            was passed under direct vision. Throughout the                            procedure, the patient's blood pressure, pulse, and                            oxygen saturations were monitored continuously. The                            [id number]-[id number] Colonoscope [id number] was introduced                            through the anus and advanced to the the  cecum,                            identified by appendiceal orifice and ileocecal                            valve. The colonoscopy was performed without                            difficulty. The patient tolerated the procedure                            well. The quality of the bowel preparation was                            good. The ileocecal valve, appendiceal orifice, and                            rectum were photographed. The bowel preparation                            used was SUPREP via split dose instruction. Scope In: 3:39:51 PM Scope Out: 3:54:51 PM Scope Withdrawal Time: 0 hours 9 minutes 59 seconds  Total Procedure Duration: 0 hours 15 minutes 0 seconds  Findings:                 The digital rectal exam findings include decreased                            voluntary tone and / paradoxical contraction with                            simulated defecation.                           A 3 mm polyp was found in the ascending colon. The                            polyp was flat. The polyp was removed with a cold  snare. Resection and retrieval were complete.                            Verification of patient identification for the                            specimen was done. Estimated blood loss was minimal.                           The exam was otherwise without abnormality on                            direct and retroflexion views. Complications:            No immediate complications. Estimated Blood Loss:     Estimated blood loss was minimal. Impression:               - Decreased voluntary tone and / paradoxical                            contraction found on digital rectal exam.                           - One 3 mm polyp in the ascending colon, removed                            with a cold snare. Resected and retrieved.                           - The examination was otherwise normal on direct                            and retroflexion  views. Recommendation:           - Patient has a contact number available for                            emergencies. The signs and symptoms of potential                            delayed complications were discussed with the                            patient. Return to normal activities tomorrow.                            Written discharge instructions were provided to the                            patient.                           - Resume previous diet.                           - Continue present medications.                           -  Repeat colonoscopy is recommended. The                            colonoscopy date will be determined after pathology                            results from today's exam become available for                            review.                           - Try increased dose of Linzess  (290ug) for                            constipation.                           [facility] pelvic floor PT if she is interested. Lupita FORBES Commander, MD 11/12/2023 4:06:15 PM This report has been signed electronically.

## 2023-11-13 ENCOUNTER — Telehealth: Payer: Self-pay

## 2023-11-13 NOTE — Telephone Encounter (Signed)
 Post procedure follow up call, no answer

## 2023-11-17 LAB — SURGICAL PATHOLOGY

## 2023-11-19 ENCOUNTER — Encounter: Payer: Self-pay | Admitting: Internal Medicine

## 2023-11-19 ENCOUNTER — Ambulatory Visit: Payer: Self-pay | Admitting: Internal Medicine

## 2023-11-19 DIAGNOSIS — Z860101 Personal history of adenomatous and serrated colon polyps: Secondary | ICD-10-CM | POA: Insufficient documentation

## 2023-12-07 ENCOUNTER — Other Ambulatory Visit: Payer: Self-pay | Admitting: Internal Medicine

## 2023-12-07 DIAGNOSIS — R1319 Other dysphagia: Secondary | ICD-10-CM

## 2023-12-07 DIAGNOSIS — K222 Esophageal obstruction: Secondary | ICD-10-CM
